# Patient Record
Sex: Female | Born: 1995 | Race: White | Hispanic: No | Marital: Married | Smoking: Former smoker
Health system: Southern US, Community
[De-identification: ages and names within clinical notes are randomized; demographics above are authoritative.]

## PROBLEM LIST (undated history)

## (undated) ENCOUNTER — Inpatient Hospital Stay (HOSPITAL_COMMUNITY): Payer: Self-pay

## (undated) DIAGNOSIS — F419 Anxiety disorder, unspecified: Secondary | ICD-10-CM

## (undated) DIAGNOSIS — F329 Major depressive disorder, single episode, unspecified: Secondary | ICD-10-CM

## (undated) DIAGNOSIS — F32A Depression, unspecified: Secondary | ICD-10-CM

## (undated) HISTORY — PX: TONSILLECTOMY: SUR1361

## (undated) HISTORY — PX: ADENOIDECTOMY: SUR15

---
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
## 2013-02-04 HISTORY — PX: WISDOM TOOTH EXTRACTION: SHX21

## 2013-12-06 ENCOUNTER — Encounter (HOSPITAL_COMMUNITY): Payer: Self-pay | Admitting: *Deleted

## 2014-11-10 ENCOUNTER — Ambulatory Visit: Payer: Self-pay | Admitting: Podiatry

## 2016-04-17 ENCOUNTER — Inpatient Hospital Stay (HOSPITAL_COMMUNITY)
Admission: AD | Admit: 2016-04-17 | Discharge: 2016-04-17 | Disposition: A | Discharge status: Home / Self Care | Payer: 59 | Source: Ambulatory Visit | Attending: Obstetrics & Gynecology | Admitting: Obstetrics & Gynecology

## 2016-04-17 ENCOUNTER — Encounter (HOSPITAL_COMMUNITY): Payer: Self-pay | Admitting: *Deleted

## 2016-04-17 DIAGNOSIS — J45909 Unspecified asthma, uncomplicated: Secondary | ICD-10-CM | POA: Diagnosis not present

## 2016-04-17 DIAGNOSIS — Z87891 Personal history of nicotine dependence: Secondary | ICD-10-CM | POA: Diagnosis not present

## 2016-04-17 DIAGNOSIS — Z3A30 30 weeks gestation of pregnancy: Secondary | ICD-10-CM | POA: Insufficient documentation

## 2016-04-17 DIAGNOSIS — O9989 Other specified diseases and conditions complicating pregnancy, childbirth and the puerperium: Secondary | ICD-10-CM

## 2016-04-17 DIAGNOSIS — O99513 Diseases of the respiratory system complicating pregnancy, third trimester: Secondary | ICD-10-CM | POA: Insufficient documentation

## 2016-04-17 DIAGNOSIS — O9A213 Injury, poisoning and certain other consequences of external causes complicating pregnancy, third trimester: Secondary | ICD-10-CM | POA: Diagnosis not present

## 2016-04-17 DIAGNOSIS — Y9301 Activity, walking, marching and hiking: Secondary | ICD-10-CM | POA: Diagnosis not present

## 2016-04-17 DIAGNOSIS — W108XXA Fall (on) (from) other stairs and steps, initial encounter: Secondary | ICD-10-CM | POA: Diagnosis not present

## 2016-04-17 DIAGNOSIS — S8002XA Contusion of left knee, initial encounter: Secondary | ICD-10-CM

## 2016-04-17 DIAGNOSIS — S80212A Abrasion, left knee, initial encounter: Secondary | ICD-10-CM | POA: Diagnosis present

## 2016-04-17 DIAGNOSIS — W109XXA Fall (on) (from) unspecified stairs and steps, initial encounter: Secondary | ICD-10-CM | POA: Diagnosis not present

## 2016-04-17 DIAGNOSIS — S8000XA Contusion of unspecified knee, initial encounter: Secondary | ICD-10-CM | POA: Insufficient documentation

## 2016-04-17 LAB — URINALYSIS, DIPSTICK ONLY
BILIRUBIN URINE: NEGATIVE
Glucose, UA: NEGATIVE mg/dL
Hgb urine dipstick: NEGATIVE
KETONES UR: NEGATIVE mg/dL
LEUKOCYTES UA: NEGATIVE
NITRITE: NEGATIVE
PH: 5.5 (ref 5.0–8.0)
Protein, ur: NEGATIVE mg/dL

## 2016-04-17 NOTE — MAU Note (Signed)
Urine in lab 

## 2016-04-17 NOTE — MAU Provider Note (Signed)
History     CSN: [id number]  Arrival date and time: 04/17/16 [id number]   First Provider Initiated Contact with Patient 04/17/16 1933      Chief Complaint  Patient presents with  . Fall   [name] is a 21 y.o. G2P0101 at 9157w2d presenting for evaluation following a fall that occurred 1 PM today. She states she tripped on the top step while going up stairs, breaking the fall with her hands but forcefully striking her left knee and left abdomen. Fetus has been very active since the fall. Denies contractions, abdominal pain, vaginal bleeding or leakage of fluid.     OB History  Gravida Para Term Preterm AB Living  2 1 0 1 0 1  SAB TAB Ectopic Multiple Live Births  0 0 0 0 1    # Outcome Date GA Lbr Len/2nd Weight Sex Delivery Anes PTL Lv  2 Current           1 Preterm 08/21/10 5377w1d / 05:19 2.895 kg (6 lb 6.1 oz) F Vag-Vacuum EPI  LIV       Past Medical History:  Diagnosis Date  . Asthma     Past Surgical History:  Procedure Laterality Date  . ADENOIDECTOMY    . TONSILLECTOMY      Family History  Problem Relation Age of Onset  . Hypertension Maternal Grandfather   . Diabetes Maternal Grandfather   . Heart disease Maternal Grandfather   . Diabetes Paternal Grandfather     Social History  Substance Use Topics  . Smoking status: Former Smoker    Packs/day: 0.25    Quit date: 03/07/2010  . Smokeless tobacco: Never Used  . Alcohol use No    Allergies:  Allergies  Allergen Reactions  . Hydrocodone Shortness Of Breath  . Sulfa Antibiotics Hives    Prescriptions Prior to Admission  Medication Sig Dispense Refill Last Dose  . prenatal vitamin w/FE, FA (PRENATAL 1 + 1) 27-1 MG TABS Take 1 tablet by mouth daily.     08/20/2010 at Unknown    Review of Systems Physical Exam   Blood pressure 99/57, pulse 97, temperature 98.3 F (36.8 C), temperature source Oral, resp. rate 18, unknown if currently breastfeeding.  Physical Exam  Nursing note and vitals  reviewed. Constitutional: She is oriented to person, place, and time. She appears well-developed and well-nourished. No distress.  HENT:  Head: Normocephalic.  Eyes: Pupils are equal, round, and reactive to light.  Neck: Normal range of motion.  GI: Soft. There is no tenderness.  siz c/w 30 wks  Musculoskeletal: Normal range of motion. She exhibits no tenderness.  Skin of abdomen and flank intact, NT  Neurological: She is alert and oriented to person, place, and time.  Skin: Skin is warm and dry.  Erythema and superficial abrasion of left knee  Psychiatric: She has a normal mood and affect. Her behavior is normal.    MAU Course  Procedures  Wynelle BourgeoisMarie Laurance Heide, CNM assumed care of patient at 2000.   Assessment and Plan    [name] CNM 04/17/2016, 7:42 PM   Ice placed on knee contusion Consulted Dr Seymour BarsLavoie who recommends monitoring a while longer and checking cervix. If not dilated may discharge home FHR is reactive and reassuring Some uterine irritability noted Dilation: Closed Effacement (%): Thick Station: Ballotable Exam by:: [name] [name] CNM  Discharge home Encouraged to return here or to other Urgent Care/ED if she develops worsening of symptoms, increase in pain, fever, or  other concerning symptoms.   Strict precautions for contractions, ROM, bleeding, decreased movement reviewed  Aviva Signs, CNM

## 2016-04-17 NOTE — MAU Note (Signed)
Pt fell around 1330 today, fell up stairs, landed on L side.  Having some L rib pain, also some aching in lower abd on sides, also a back ache.  Denies bleeding or LOF.

## 2016-04-17 NOTE — Discharge Instructions (Signed)
Third Trimester of Pregnancy The third trimester is from week 28 through week 40 (months 7 through 9). The third trimester is a time when the unborn baby (fetus) is growing rapidly. At the end of the ninth month, the fetus is about 20 inches in length and weighs 6-10 pounds. Body changes during your third trimester Your body will continue to go through many changes during pregnancy. The changes vary from woman to woman. During the third trimester:  Your weight will continue to increase. You can expect to gain 25-35 pounds (11-16 kg) by the end of the pregnancy.  You may begin to get stretch marks on your hips, abdomen, and breasts.  You may urinate more often because the fetus is moving lower into your pelvis and pressing on your bladder.  You may develop or continue to have heartburn. This is caused by increased hormones that slow down muscles in the digestive tract.  You may develop or continue to have constipation because increased hormones slow digestion and cause the muscles that push waste through your intestines to relax.  You may develop hemorrhoids. These are swollen veins (varicose veins) in the rectum that can itch or be painful.  You may develop swollen, bulging veins (varicose veins) in your legs.  You may have increased body aches in the pelvis, back, or thighs. This is due to weight gain and increased hormones that are relaxing your joints.  You may have changes in your hair. These can include thickening of your hair, rapid growth, and changes in texture. Some women also have hair loss during or after pregnancy, or hair that feels dry or thin. Your hair will most likely return to normal after your baby is born.  Your breasts will continue to grow and they will continue to become tender. A yellow fluid (colostrum) may leak from your breasts. This is the first milk you are producing for your baby.  Your belly button may stick out.  You may notice more swelling in your hands,  face, or ankles.  You may have increased tingling or numbness in your hands, arms, and legs. The skin on your belly may also feel numb.  You may feel short of breath because of your expanding uterus.  You may have more problems sleeping. This can be caused by the size of your belly, increased need to urinate, and an increase in your body's metabolism.  You may notice the fetus "dropping," or moving lower in your abdomen (lightening).  You may have increased vaginal discharge.  You may notice your joints feel loose and you may have pain around your pelvic bone. What to expect at prenatal visits You will have prenatal exams every 2 weeks until week 36. Then you will have weekly prenatal exams. During a routine prenatal visit:  You will be weighed to make sure you and the baby are growing normally.  Your blood pressure will be taken.  Your abdomen will be measured to track your baby's growth.  The fetal heartbeat will be listened to.  Any test results from the previous visit will be discussed.  You may have a cervical check near your due date to see if your cervix has softened or thinned (effaced).  You will be tested for Group B streptococcus. This happens between 35 and 37 weeks. Your health care provider may ask you:  What your birth plan is.  How you are feeling.  If you are feeling the baby move.  If you have had any abnormal  symptoms, such as leaking fluid, bleeding, severe headaches, or abdominal cramping.  If you are using any tobacco products, including cigarettes, chewing tobacco, and electronic cigarettes.  If you have any questions. Other tests or screenings that may be performed during your third trimester include:  Blood tests that check for low iron levels (anemia).  Fetal testing to check the health, activity level, and growth of the fetus. Testing is done if you have certain medical conditions or if there are problems during the pregnancy.  Nonstress test  (NST). This test checks the health of your baby to make sure there are no signs of problems, such as the baby not getting enough oxygen. During this test, a belt is placed around your belly. The baby is made to move, and its heart rate is monitored during movement. What is false labor? False labor is a condition in which you feel small, irregular tightenings of the muscles in the womb (contractions) that usually go away with rest, changing position, or drinking water. These are called Braxton Hicks contractions. Contractions may last for hours, days, or even weeks before true labor sets in. If contractions come at regular intervals, become more frequent, increase in intensity, or become painful, you should see your health care provider. What are the signs of labor?  Abdominal cramps.  Regular contractions that start at 10 minutes apart and become stronger and more frequent with time.  Contractions that start on the top of the uterus and spread down to the lower abdomen and back.  Increased pelvic pressure and dull back pain.  A watery or bloody mucus discharge that comes from the vagina.  Leaking of amniotic fluid. This is also known as your "water breaking." It could be a slow trickle or a gush. Let your health care provider know if it has a color or strange odor. If you have any of these signs, call your health care provider right away, even if it is before your due date. Follow these instructions at home: Medicines   Follow your health care provider's instructions regarding medicine use. Specific medicines may be either safe or unsafe to take during pregnancy.  Take a prenatal vitamin that contains at least 600 micrograms (mcg) of folic acid.  If you develop constipation, try taking a stool softener if your health care provider approves. Eating and drinking   Eat a balanced diet that includes fresh fruits and vegetables, whole grains, good sources of protein such as meat, eggs, or tofu,  and low-fat dairy. Your health care provider will help you determine the amount of weight gain that is right for you.  Avoid raw meat and uncooked cheese. These carry germs that can cause birth defects in the baby.  If you have low calcium intake from food, talk to your health care provider about whether you should take a daily calcium supplement.  Eat four or five small meals rather than three large meals a day.  Limit foods that are high in fat and processed sugars, such as fried and sweet foods.  To prevent constipation:  Drink enough fluid to keep your urine clear or pale yellow.  Eat foods that are high in fiber, such as fresh fruits and vegetables, whole grains, and beans. Activity   Exercise only as directed by your health care provider. Most women can continue their usual exercise routine during pregnancy. Try to exercise for 30 minutes at least 5 days a week. Stop exercising if you experience uterine contractions.  Avoid heavy lifting.  Do not exercise in extreme heat or humidity, or at high altitudes.  Wear low-heel, comfortable shoes.  Practice good posture.  You may continue to have sex unless your health care provider tells you otherwise. Relieving pain and discomfort   Take frequent breaks and rest with your legs elevated if you have leg cramps or low back pain.  Take warm sitz baths to soothe any pain or discomfort caused by hemorrhoids. Use hemorrhoid cream if your health care provider approves.  Wear a good support bra to prevent discomfort from breast tenderness.  If you develop varicose veins:  Wear support pantyhose or compression stockings as told by your healthcare provider.  Elevate your feet for 15 minutes, 3-4 times a day. Prenatal care   Write down your questions. Take them to your prenatal visits.  Keep all your prenatal visits as told by your health care provider. This is important. Safety   Wear your seat belt at all times when  driving.  Make a list of emergency phone numbers, including numbers for family, friends, the hospital, and police and fire departments. General instructions   Avoid cat litter boxes and soil used by cats. These carry germs that can cause birth defects in the baby. If you have a cat, ask someone to clean the litter box for you.  Do not travel far distances unless it is absolutely necessary and only with the approval of your health care provider.  Do not use hot tubs, steam rooms, or saunas.  Do not drink alcohol.  Do not use any products that contain nicotine or tobacco, such as cigarettes and e-cigarettes. If you need help quitting, ask your health care provider.  Do not use any medicinal herbs or unprescribed drugs. These chemicals affect the formation and growth of the baby.  Do not douche or use tampons or scented sanitary pads.  Do not cross your legs for long periods of time.  To prepare for the arrival of your baby:  Take prenatal classes to understand, practice, and ask questions about labor and delivery.  Make a trial run to the hospital.  Visit the hospital and tour the maternity area.  Arrange for maternity or paternity leave through employers.  Arrange for family and friends to take care of pets while you are in the hospital.  Purchase a rear-facing car seat and make sure you know how to install it in your car.  Pack your hospital bag.  Prepare the babys nursery. Make sure to remove all pillows and stuffed animals from the baby's crib to prevent suffocation.  Visit your dentist if you have not gone during your pregnancy. Use a soft toothbrush to brush your teeth and be gentle when you floss. Contact a health care provider if:  You are unsure if you are in labor or if your water has broken.  You become dizzy.  You have mild pelvic cramps, pelvic pressure, or nagging pain in your abdominal area.  You have lower back pain.  You have persistent nausea,  vomiting, or diarrhea.  You have an unusual or bad smelling vaginal discharge.  You have pain when you urinate. Get help right away if:  Your water breaks before 37 weeks.  You have regular contractions less than 5 minutes apart before 37 weeks.  You have a fever.  You are leaking fluid from your vagina.  You have spotting or bleeding from your vagina.  You have severe abdominal pain or cramping.  You have rapid weight loss or  weight gain.  You have shortness of breath with chest pain.  You notice sudden or extreme swelling of your face, hands, ankles, feet, or legs.  Your baby makes fewer than 10 movements in 2 hours.  You have severe headaches that do not go away when you take medicine.  You have vision changes. Summary  The third trimester is from week 28 through week 40, months 7 through 9. The third trimester is a time when the unborn baby (fetus) is growing rapidly.  During the third trimester, your discomfort may increase as you and your baby continue to gain weight. You may have abdominal, leg, and back pain, sleeping problems, and an increased need to urinate.  During the third trimester your breasts will keep growing and they will continue to become tender. A yellow fluid (colostrum) may leak from your breasts. This is the first milk you are producing for your baby.  False labor is a condition in which you feel small, irregular tightenings of the muscles in the womb (contractions) that eventually go away. These are called Braxton Hicks contractions. Contractions may last for hours, days, or even weeks before true labor sets in.  Signs of labor can include: abdominal cramps; regular contractions that start at 10 minutes apart and become stronger and more frequent with time; watery or bloody mucus discharge that comes from the vagina; increased pelvic pressure and dull back pain; and leaking of amniotic fluid. This information is not intended to replace advice given  to you by your health care provider. Make sure you discuss any questions you have with your health care provider. Document Released: 01/15/2001 Document Revised: 06/29/2015 Document Reviewed: 03/24/2012 Elsevier Interactive Patient Education  2017 [employer] Contusion A contusion is a deep bruise. Contusions are the result of a blunt injury to tissues and muscle fibers under the skin. The injury causes bleeding under the skin. The skin overlying the contusion may turn blue, purple, or yellow. Minor injuries will give you a painless contusion, but more severe contusions may stay painful and swollen for a few weeks. What are the causes? This condition is usually caused by a blow, trauma, or direct force to an area of the body. What are the signs or symptoms? Symptoms of this condition include:  Swelling of the injured area.  Pain and tenderness in the injured area.  Discoloration. The area may have redness and then turn blue, purple, or yellow. How is this diagnosed? This condition is diagnosed based on a physical exam and medical history. An X-ray, CT scan, or MRI may be needed to determine if there are any associated injuries, such as broken bones (fractures). How is this treated? Specific treatment for this condition depends on what area of the body was injured. In general, the best treatment for a contusion is resting, icing, applying pressure to (compression), and elevating the injured area. This is often called the RICE strategy. Over-the-counter anti-inflammatory medicines may also be recommended for pain control. Follow these instructions at home:  Rest the injured area.  If directed, apply ice to the injured area:  Put ice in a plastic bag.  Place a towel between your skin and the bag.  Leave the ice on for 20 minutes, 2-3 times per day.  If directed, apply light compression to the injured area using an elastic bandage. Make sure the bandage is not wrapped too tightly.  Remove and reapply the bandage as directed by your health care provider.  If possible, raise (elevate) the  injured area above the level of your heart while you are sitting or lying down.  Take over-the-counter and prescription medicines only as told by your health care provider. Contact a health care provider if:  Your symptoms do not improve after several days of treatment.  Your symptoms get worse.  You have difficulty moving the injured area. Get help right away if:  You have severe pain.  You have numbness in a hand or foot.  Your hand or foot turns pale or cold. This information is not intended to replace advice given to you by your health care provider. Make sure you discuss any questions you have with your health care provider. Document Released: 10/31/2004 Document Revised: 06/01/2015 Document Reviewed: 06/08/2014 Elsevier Interactive Patient Education  2017 [employer].

## 2016-05-16 ENCOUNTER — Inpatient Hospital Stay (HOSPITAL_COMMUNITY)
Admission: AD | Admit: 2016-05-16 | Discharge: 2016-05-16 | Disposition: A | Discharge status: Home / Self Care | Payer: 59 | Source: Ambulatory Visit | Attending: Obstetrics and Gynecology | Admitting: Obstetrics and Gynecology

## 2016-05-16 ENCOUNTER — Other Ambulatory Visit: Payer: Self-pay

## 2016-05-16 DIAGNOSIS — O09213 Supervision of pregnancy with history of pre-term labor, third trimester: Secondary | ICD-10-CM

## 2016-05-16 DIAGNOSIS — O09219 Supervision of pregnancy with history of pre-term labor, unspecified trimester: Secondary | ICD-10-CM | POA: Insufficient documentation

## 2016-05-16 MED ORDER — BETAMETHASONE SOD PHOS & ACET 6 (3-3) MG/ML IJ SUSP
12.0000 mg | Freq: Once | INTRAMUSCULAR | Status: AC
  Administered 2016-05-16: 12 mg via INTRAMUSCULAR
  Filled 2016-05-16: qty 2

## 2016-05-16 MED ORDER — BETAMETHASONE SOD PHOS & ACET 6 (3-3) MG/ML IJ SUSP: 12.0000 mg | INTRAMUSCULAR | Status: AC

## 2016-05-16 NOTE — MAU Note (Signed)
Sent from [facility] [facility] for betamethasone.

## 2016-05-17 ENCOUNTER — Inpatient Hospital Stay (HOSPITAL_COMMUNITY)
Admission: AD | Admit: 2016-05-17 | Discharge: 2016-05-17 | Disposition: A | Discharge status: Home / Self Care | Payer: 59 | Source: Ambulatory Visit | Attending: Obstetrics and Gynecology | Admitting: Obstetrics and Gynecology

## 2016-05-17 DIAGNOSIS — Z3A34 34 weeks gestation of pregnancy: Secondary | ICD-10-CM | POA: Diagnosis not present

## 2016-05-17 DIAGNOSIS — O09213 Supervision of pregnancy with history of pre-term labor, third trimester: Secondary | ICD-10-CM | POA: Insufficient documentation

## 2016-05-17 MED ORDER — BETAMETHASONE SOD PHOS & ACET 6 (3-3) MG/ML IJ SUSP
12.0000 mg | Freq: Once | INTRAMUSCULAR | Status: AC
  Administered 2016-05-17: 12 mg via INTRAMUSCULAR
  Filled 2016-05-17: qty 2

## 2016-05-17 NOTE — MAU Note (Signed)
No c/o.  Did ok with injection yesterday, nothing has changed

## 2016-11-07 ENCOUNTER — Other Ambulatory Visit: Payer: Self-pay | Admitting: Obstetrics and Gynecology

## 2016-11-08 ENCOUNTER — Ambulatory Visit
Admission: RE | Admit: 2016-11-08 | Discharge: 2016-11-08 | Disposition: A | Discharge status: Home / Self Care | Payer: 59 | Source: Ambulatory Visit | Attending: Obstetrics and Gynecology | Admitting: Obstetrics and Gynecology

## 2016-11-08 ENCOUNTER — Other Ambulatory Visit: Payer: Self-pay | Admitting: Obstetrics and Gynecology

## 2016-11-08 ENCOUNTER — Other Ambulatory Visit: Payer: 59

## 2016-11-08 ENCOUNTER — Encounter (HOSPITAL_COMMUNITY): Payer: Self-pay | Admitting: *Deleted

## 2016-11-08 DIAGNOSIS — T8332XA Displacement of intrauterine contraceptive device, initial encounter: Secondary | ICD-10-CM

## 2016-11-08 DIAGNOSIS — T8332XD Displacement of intrauterine contraceptive device, subsequent encounter: Secondary | ICD-10-CM

## 2016-11-10 NOTE — H&P (Signed)
NAMEIVYLYNN, Heather Calderon NO.:  [id number]  MEDICAL RECORD NO.:  [id number]  LOCATION:  [facility]                         FACILITY:  [facility]  PHYSICIAN:  Lenoard Aden, M.D.DATE OF BIRTH:  November 22, 1995  DATE OF ADMISSION:  11/11/2016 DATE OF DISCHARGE:                             HISTORY & PHYSICAL   CHIEF COMPLAINT:  Extrauterine IUD.  HISTORY OF PRESENT ILLNESS:  The patient is a 21 year old female G2, P2 with a history of uncomplicated IUD placement done in June, presenting for followup with lost IUD strings.  Ultrasound and KUB raise suspicion of an IUD in the pelvis.  CAT scan confirms a right lateral pelvic placement of an IUD consistent with perforation.  ALLERGIES:  The patient has allergies to hydrocodone, sulfa, and nuts.  FAMILY HISTORY:  Of thrombophlebitis, hypertension, and diabetes.  SOCIAL HISTORY:  Nonsmoker, nondrinker.  Denies domestic or physical violence.  GYNECOLOGIC HISTORY:  History of vaginal delivery x2.  PHYSICAL EXAMINATION:  GENERAL:  Well-developed, well-nourished female; in no acute distress. HEENT:  Normal. NECK:  Supple.  Full range of motion. LUNGS:  Clear. HEART:  Regular rate and rhythm. ABDOMEN:  Soft, nontender. PELVIC:  Reveals a normal size uterus.  No adnexal tenderness and no adnexal masses.  No IUD strings are seen.  The uterus is retroverted and nontender.  IMPRESSION:  Extrapelvic intrauterine contraceptive device, status post perforation likely upon placement in June 2018.  PLAN:  Proceed with diagnostic laparoscopy, removal of IUD.  The patient is status post antibiotic treatment and negative pregnancy test.  Risks of anesthesia, infection, bleeding, injury to surrounding organs, and possible need for repair was discussed; delayed versus immediate complications to include bowel and bladder injury noted.  The patient acknowledges and wishes to proceed.     Lenoard Aden, M.D.     RJT/MEDQ  D:   11/10/2016  T:  11/10/2016  Job:  [id number]  cc:   Dr. __________

## 2016-11-11 ENCOUNTER — Encounter (HOSPITAL_COMMUNITY): Payer: Self-pay

## 2016-11-11 ENCOUNTER — Ambulatory Visit (HOSPITAL_COMMUNITY): Payer: 59 | Admitting: Certified Registered Nurse Anesthetist

## 2016-11-11 ENCOUNTER — Encounter (HOSPITAL_COMMUNITY)
Admission: RE | Disposition: A | Discharge status: Home / Self Care | Payer: Self-pay | Source: Ambulatory Visit | Attending: Obstetrics and Gynecology

## 2016-11-11 ENCOUNTER — Ambulatory Visit (HOSPITAL_COMMUNITY)
Admission: RE | Admit: 2016-11-11 | Discharge: 2016-11-11 | Disposition: A | Discharge status: Home / Self Care | Payer: 59 | Source: Ambulatory Visit | Attending: Obstetrics and Gynecology | Admitting: Obstetrics and Gynecology

## 2016-11-11 DIAGNOSIS — Z91018 Allergy to other foods: Secondary | ICD-10-CM | POA: Diagnosis not present

## 2016-11-11 DIAGNOSIS — T8389XA Other specified complication of genitourinary prosthetic devices, implants and grafts, initial encounter: Secondary | ICD-10-CM | POA: Diagnosis not present

## 2016-11-11 DIAGNOSIS — N736 Female pelvic peritoneal adhesions (postinfective): Secondary | ICD-10-CM | POA: Diagnosis not present

## 2016-11-11 DIAGNOSIS — T8332XA Displacement of intrauterine contraceptive device, initial encounter: Secondary | ICD-10-CM | POA: Insufficient documentation

## 2016-11-11 DIAGNOSIS — Z885 Allergy status to narcotic agent status: Secondary | ICD-10-CM | POA: Diagnosis not present

## 2016-11-11 DIAGNOSIS — Z882 Allergy status to sulfonamides status: Secondary | ICD-10-CM | POA: Insufficient documentation

## 2016-11-11 HISTORY — PX: LAPAROSCOPY: SHX197

## 2016-11-11 HISTORY — DX: Anxiety disorder, unspecified: F41.9

## 2016-11-11 HISTORY — DX: Depression, unspecified: F32.A

## 2016-11-11 HISTORY — DX: Major depressive disorder, single episode, unspecified: F32.9

## 2016-11-11 LAB — CBC
HEMATOCRIT: 45 % (ref 36.0–46.0)
Hemoglobin: 15.9 g/dL — ABNORMAL HIGH (ref 12.0–15.0)
MCH: 29.7 pg (ref 26.0–34.0)
MCHC: 35.3 g/dL (ref 30.0–36.0)
MCV: 84.1 fL (ref 78.0–100.0)
Platelets: 272 10*3/uL (ref 150–400)
RBC: 5.35 MIL/uL — AB (ref 3.87–5.11)
RDW: 12.5 % (ref 11.5–15.5)
WBC: 8.1 10*3/uL (ref 4.0–10.5)

## 2016-11-11 LAB — HCG, SERUM, QUALITATIVE: Preg, Serum: NEGATIVE

## 2016-11-11 SURGERY — LAPAROSCOPY, DIAGNOSTIC
Anesthesia: General | Site: Abdomen

## 2016-11-11 MED ORDER — PROPOFOL 10 MG/ML IV BOLUS
INTRAVENOUS | Status: DC | PRN
  Administered 2016-11-11: 200 mg via INTRAVENOUS

## 2016-11-11 MED ORDER — BUPIVACAINE HCL (PF) 0.25 % IJ SOLN
INTRAMUSCULAR | Status: AC
  Filled 2016-11-11: qty 30

## 2016-11-11 MED ORDER — LIDOCAINE HCL (CARDIAC) 20 MG/ML IV SOLN
INTRAVENOUS | Status: DC | PRN
  Administered 2016-11-11: 50 mg via INTRAVENOUS

## 2016-11-11 MED ORDER — SODIUM CHLORIDE 0.9 % IR SOLN
Status: DC | PRN
  Administered 2016-11-11: 3000 mL

## 2016-11-11 MED ORDER — HYDROMORPHONE HCL 2 MG PO TABS: ORAL_TABLET | ORAL | 0 refills | Status: DC

## 2016-11-11 MED ORDER — PROMETHAZINE HCL 25 MG/ML IJ SOLN
INTRAMUSCULAR | Status: AC
  Filled 2016-11-11: qty 1

## 2016-11-11 MED ORDER — SUGAMMADEX SODIUM 200 MG/2ML IV SOLN
INTRAVENOUS | Status: AC
  Filled 2016-11-11: qty 2

## 2016-11-11 MED ORDER — SODIUM CHLORIDE 0.9 % IJ SOLN
INTRAMUSCULAR | Status: AC
  Filled 2016-11-11: qty 10

## 2016-11-11 MED ORDER — LACTATED RINGERS IV SOLN
INTRAVENOUS | Status: DC
  Administered 2016-11-11 (×2): via INTRAVENOUS

## 2016-11-11 MED ORDER — SUGAMMADEX SODIUM 200 MG/2ML IV SOLN
INTRAVENOUS | Status: DC | PRN
  Administered 2016-11-11: 150 mg via INTRAVENOUS

## 2016-11-11 MED ORDER — MEPERIDINE HCL 25 MG/ML IJ SOLN: 6.2500 mg | INTRAMUSCULAR | Status: DC | PRN

## 2016-11-11 MED ORDER — PROMETHAZINE HCL 25 MG/ML IJ SOLN
6.2500 mg | Freq: Once | INTRAMUSCULAR | Status: AC
  Administered 2016-11-11: 6.25 mg via INTRAVENOUS

## 2016-11-11 MED ORDER — KETOROLAC TROMETHAMINE 30 MG/ML IJ SOLN
INTRAMUSCULAR | Status: DC | PRN
  Administered 2016-11-11: 30 mg via INTRAVENOUS

## 2016-11-11 MED ORDER — SCOPOLAMINE 1 MG/3DAYS TD PT72
1.0000 | MEDICATED_PATCH | Freq: Once | TRANSDERMAL | Status: DC
  Administered 2016-11-11: 1.5 mg via TRANSDERMAL

## 2016-11-11 MED ORDER — CEFAZOLIN SODIUM-DEXTROSE 2-4 GM/100ML-% IV SOLN
2.0000 g | INTRAVENOUS | Status: AC
  Administered 2016-11-11: 2 g via INTRAVENOUS

## 2016-11-11 MED ORDER — MIDAZOLAM HCL 2 MG/2ML IJ SOLN
INTRAMUSCULAR | Status: AC
  Filled 2016-11-11: qty 4

## 2016-11-11 MED ORDER — FENTANYL CITRATE (PF) 100 MCG/2ML IJ SOLN
INTRAMUSCULAR | Status: AC
  Filled 2016-11-11: qty 2

## 2016-11-11 MED ORDER — HYDROMORPHONE HCL 1 MG/ML IJ SOLN
INTRAMUSCULAR | Status: AC
  Filled 2016-11-11: qty 1

## 2016-11-11 MED ORDER — DEXAMETHASONE SODIUM PHOSPHATE 10 MG/ML IJ SOLN
INTRAMUSCULAR | Status: DC | PRN
  Administered 2016-11-11: 4 mg via INTRAVENOUS

## 2016-11-11 MED ORDER — LACTATED RINGERS IV SOLN: INTRAVENOUS | Status: DC

## 2016-11-11 MED ORDER — HYDROMORPHONE HCL 1 MG/ML IJ SOLN
0.2500 mg | INTRAMUSCULAR | Status: DC | PRN
  Administered 2016-11-11: 0.5 mg via INTRAVENOUS

## 2016-11-11 MED ORDER — MIDAZOLAM HCL 2 MG/2ML IJ SOLN
INTRAMUSCULAR | Status: DC | PRN
  Administered 2016-11-11: 2 mg via INTRAVENOUS

## 2016-11-11 MED ORDER — ROCURONIUM BROMIDE 100 MG/10ML IV SOLN
INTRAVENOUS | Status: DC | PRN
  Administered 2016-11-11: 40 mg via INTRAVENOUS

## 2016-11-11 MED ORDER — CEFAZOLIN SODIUM-DEXTROSE 2-4 GM/100ML-% IV SOLN
INTRAVENOUS | Status: AC
  Filled 2016-11-11: qty 100

## 2016-11-11 MED ORDER — BUPIVACAINE HCL (PF) 0.25 % IJ SOLN
INTRAMUSCULAR | Status: DC | PRN
  Administered 2016-11-11: 20 mL

## 2016-11-11 MED ORDER — KETOROLAC TROMETHAMINE 30 MG/ML IJ SOLN
INTRAMUSCULAR | Status: AC
  Filled 2016-11-11: qty 1

## 2016-11-11 MED ORDER — FENTANYL CITRATE (PF) 100 MCG/2ML IJ SOLN
INTRAMUSCULAR | Status: DC | PRN
  Administered 2016-11-11 (×2): 50 ug via INTRAVENOUS

## 2016-11-11 MED ORDER — DEXAMETHASONE SODIUM PHOSPHATE 4 MG/ML IJ SOLN
INTRAMUSCULAR | Status: AC
  Filled 2016-11-11: qty 2

## 2016-11-11 MED ORDER — OXYCODONE HCL 5 MG/5ML PO SOLN: 5.0000 mg | Freq: Once | ORAL | Status: DC | PRN

## 2016-11-11 MED ORDER — HYDROMORPHONE HCL 2 MG PO TABS
ORAL_TABLET | ORAL | Status: AC
  Filled 2016-11-11: qty 1

## 2016-11-11 MED ORDER — LIDOCAINE HCL (PF) 1 % IJ SOLN
INTRAMUSCULAR | Status: AC
  Filled 2016-11-11: qty 5

## 2016-11-11 MED ORDER — OXYCODONE HCL 5 MG PO TABS: 5.0000 mg | ORAL_TABLET | Freq: Once | ORAL | Status: DC | PRN

## 2016-11-11 MED ORDER — PROMETHAZINE HCL 25 MG/ML IJ SOLN: 6.2500 mg | INTRAMUSCULAR | Status: DC | PRN

## 2016-11-11 MED ORDER — ONDANSETRON HCL 4 MG/2ML IJ SOLN
INTRAMUSCULAR | Status: DC | PRN
  Administered 2016-11-11: 4 mg via INTRAVENOUS

## 2016-11-11 MED ORDER — ONDANSETRON HCL 4 MG/2ML IJ SOLN
INTRAMUSCULAR | Status: AC
  Filled 2016-11-11: qty 4

## 2016-11-11 MED ORDER — SCOPOLAMINE 1 MG/3DAYS TD PT72
MEDICATED_PATCH | TRANSDERMAL | Status: DC
Start: 2016-11-11 — End: 2016-11-11
  Administered 2016-11-11: 1.5 mg via TRANSDERMAL
  Filled 2016-11-11: qty 1

## 2016-11-11 MED ORDER — HYDROMORPHONE HCL 2 MG PO TABS
2.0000 mg | ORAL_TABLET | Freq: Once | ORAL | Status: AC
  Administered 2016-11-11: 2 mg via ORAL

## 2016-11-11 MED ORDER — PROPOFOL 10 MG/ML IV BOLUS
INTRAVENOUS | Status: AC
  Filled 2016-11-11: qty 40

## 2016-11-11 MED ORDER — OXYCODONE HCL 5 MG PO TABS
ORAL_TABLET | ORAL | Status: AC
  Filled 2016-11-11: qty 1

## 2016-11-11 MED ORDER — OXYCODONE-ACETAMINOPHEN 5-325 MG PO TABS: 1.0000 | ORAL_TABLET | ORAL | 0 refills | Status: DC | PRN

## 2016-11-11 SURGICAL SUPPLY — 30 items
ADH SKN CLS APL DERMABOND .7 (GAUZE/BANDAGES/DRESSINGS) ×1
CABLE HIGH FREQUENCY MONO STRZ (ELECTRODE) IMPLANT
CATH ROBINSON RED A/P 16FR (CATHETERS) IMPLANT
CLOTH BEACON ORANGE TIMEOUT ST (SAFETY) ×3 IMPLANT
DERMABOND ADVANCED (GAUZE/BANDAGES/DRESSINGS) ×2
DERMABOND ADVANCED .7 DNX12 (GAUZE/BANDAGES/DRESSINGS) ×1 IMPLANT
DRSG OPSITE POSTOP 3X4 (GAUZE/BANDAGES/DRESSINGS) ×2 IMPLANT
FORCEPS CUTTING 33CM 5MM (CUTTING FORCEPS) IMPLANT
FORCEPS CUTTING 45CM 5MM (CUTTING FORCEPS) IMPLANT
GLOVE BIO SURGEON STRL SZ7.5 (GLOVE) ×3 IMPLANT
GLOVE BIOGEL PI IND STRL 7.0 (GLOVE) ×1 IMPLANT
GLOVE BIOGEL PI INDICATOR 7.0 (GLOVE) ×2
GOWN STRL REUS W/TWL LRG LVL3 (GOWN DISPOSABLE) ×3 IMPLANT
NEEDLE INSUFFLATION 120MM (ENDOMECHANICALS) ×3 IMPLANT
PACK LAPAROSCOPY BASIN (CUSTOM PROCEDURE TRAY) ×3 IMPLANT
PACK TRENDGUARD 450 HYBRID PRO (MISCELLANEOUS) IMPLANT
PACK TRENDGUARD 600 HYBRD PROC (MISCELLANEOUS) IMPLANT
PROTECTOR NERVE ULNAR (MISCELLANEOUS) ×4 IMPLANT
SET IRRIG TUBING LAPAROSCOPIC (IRRIGATION / IRRIGATOR) ×2 IMPLANT
SLEEVE XCEL OPT CAN 5 100 (ENDOMECHANICALS) ×3 IMPLANT
SOLUTION ELECTROLUBE (MISCELLANEOUS) ×3 IMPLANT
SUT VICRYL 0 UR6 27IN ABS (SUTURE) ×3 IMPLANT
SUT VICRYL 4-0 PS2 18IN ABS (SUTURE) ×6 IMPLANT
TOWEL OR 17X24 6PK STRL BLUE (TOWEL DISPOSABLE) ×6 IMPLANT
TRAY FOLEY CATH SILVER 14FR (SET/KITS/TRAYS/PACK) ×3 IMPLANT
TRENDGUARD 450 HYBRID PRO PACK (MISCELLANEOUS) ×3
TRENDGUARD 600 HYBRID PROC PK (MISCELLANEOUS)
TROCAR OPTI TIP 5M 100M (ENDOMECHANICALS) ×4 IMPLANT
TROCAR XCEL DIL TIP R 11M (ENDOMECHANICALS) ×3 IMPLANT
WARMER LAPAROSCOPE (MISCELLANEOUS) ×3 IMPLANT

## 2016-11-11 NOTE — Anesthesia Preprocedure Evaluation (Addendum)
Anesthesia Evaluation  Patient identified by MRN, date of birth, ID band Patient awake    Reviewed: Allergy & Precautions, NPO status , Patient's Chart, lab work & pertinent test results  Airway Mallampati: I  TM Distance: >3 FB Neck ROM: Full    Dental  (+) Teeth Intact, Dental Advisory Given   Pulmonary asthma , former smoker,    breath sounds clear to auscultation       Cardiovascular negative cardio ROS   Rhythm:Regular Rate:Normal     Neuro/Psych Anxiety negative neurological ROS     GI/Hepatic negative GI ROS, Neg liver ROS,   Endo/Other  negative endocrine ROS  Renal/GU negative Renal ROS     Musculoskeletal negative musculoskeletal ROS (+)   Abdominal   Peds  Hematology negative hematology ROS (+)   Anesthesia Other Findings Day of surgery medications reviewed with the patient.  Reproductive/Obstetrics                            Anesthesia Physical Anesthesia Plan  ASA: II  Anesthesia Plan: General   Post-op Pain Management:    Induction: Intravenous  PONV Risk Score and Plan: 4 or greater and Ondansetron, Dexamethasone, Midazolam, Scopolamine patch - Pre-op and Treatment may vary due to age or medical condition  Airway Management Planned: Oral ETT  Additional Equipment:   Intra-op Plan:   Post-operative Plan: Extubation in OR  Informed Consent: I have reviewed the patients History and Physical, chart, labs and discussed the procedure including the risks, benefits and alternatives for the proposed anesthesia with the patient or authorized representative who has indicated his/her understanding and acceptance.   Dental advisory given  Plan Discussed with: CRNA  Anesthesia Plan Comments:        Anesthesia Quick Evaluation

## 2016-11-11 NOTE — Transfer of Care (Signed)
Immediate Anesthesia Transfer of Care Note  Patient: Heather Calderon  Procedure(s) Performed: LAPAROSCOPY DIAGNOSTIC/IUD Removal (N/A Abdomen)  Patient Location: PACU  Anesthesia Type:General  Level of Consciousness: awake, alert  and oriented  Airway & Oxygen Therapy: Patient Spontanous Breathing and Patient connected to nasal cannula oxygen  Post-op Assessment: Report given to RN and Post -op Vital signs reviewed and stable  Post vital signs: Reviewed and stable  Last Vitals:  Vitals:   11/11/16 0738  Pulse: 76  Resp: 16  Temp: 36.7 C  SpO2: 98%    Last Pain:  Vitals:   11/11/16 0738  TempSrc: Oral  PainSc: 1       Patients Stated Pain Goal: 3 (11/11/16 1610)  Complications: No apparent anesthesia complications

## 2016-11-11 NOTE — Anesthesia Procedure Notes (Signed)
Procedure Name: Intubation Date/Time: 11/11/2016 9:02 AM Performed by: Cleda Clarks Pre-anesthesia Checklist: Patient identified, Emergency Drugs available, Suction available and Patient being monitored Patient Re-evaluated:Patient Re-evaluated prior to induction Oxygen Delivery Method: Circle system utilized Preoxygenation: Pre-oxygenation with 100% oxygen Induction Type: IV induction Ventilation: Mask ventilation without difficulty Laryngoscope Size: Miller and 2 Grade View: Grade I Tube type: Oral Tube size: 7.0 mm Number of attempts: 1 Airway Equipment and Method: Stylet Placement Confirmation: ETT inserted through vocal cords under direct vision,  positive ETCO2 and breath sounds checked- equal and bilateral Secured at: 21 cm Tube secured with: Tape Dental Injury: Teeth and Oropharynx as per pre-operative assessment

## 2016-11-11 NOTE — Progress Notes (Signed)
Patient seen and examined. Consent witnessed and signed. No changes noted. Update completed. Patient ID: Heather Calderon, female   DOB: [birth year], 21 y.o.   MRN: [id number]

## 2016-11-11 NOTE — Discharge Instructions (Signed)
DISCHARGE INSTRUCTIONS: Laparoscopy  The following instructions have been prepared to help you care for yourself upon your return home today.  Wound care: . Do not get the incision wet for the first 24 hours. The incision should be kept clean and dry. . The Band-Aids or dressings may be removed the day after surgery. . Should the incision become sore, red, and swollen after the first week, check with your doctor.  Personal hygiene: . Shower the day after your procedure.  Activity and limitations: . Do NOT drive or operate any equipment today. . Do NOT lift anything more than 15 pounds for 2-3 weeks after surgery. . Do NOT rest in bed all day. . Walking is encouraged. Walk each day, starting slowly with 5-minute walks 3 or 4 times a day. Slowly increase the length of your walks. . Walk up and down stairs slowly. . Do NOT do strenuous activities, such as golfing, playing tennis, bowling, running, biking, weight lifting, gardening, mowing, or vacuuming for 2-4 weeks. Ask your doctor when it is okay to start.  Diet: Eat a light meal as desired this evening. You may resume your usual diet tomorrow.  Return to work: This is dependent on the type of work you do. For the most part you can return to a desk job within a week of surgery. If you are more active at work, please discuss this with your doctor.  What to expect after your surgery: You may have a slight burning sensation when you urinate on the first day. You may have a very small amount of blood in the urine. Expect to have a small amount of vaginal discharge/light bleeding for 1-2 weeks. It is not unusual to have abdominal soreness and bruising for up to 2 weeks. You may be tired and need more rest for about 1 week. You may experience shoulder pain for 24-72 hours. Lying flat in bed may relieve it.  Call your doctor for any of the following: . Develop a fever of 100.4 or greater . Inability to urinate 6 hours after discharge from  hospital . Severe pain not relieved by pain medications . Persistent of heavy bleeding at incision site . Redness or swelling around incision site after a week . Increasing nausea or vomiting  Patient Signature________________________________________ Nurse Signature_________________________________________     Post Anesthesia Home Care Instructions  Activity: Get plenty of rest for the remainder of the day. A responsible individual must stay with you for 24 hours following the procedure.  For the next 24 hours, DO NOT: -Drive a car -Operate machinery -Drink alcoholic beverages -Take any medication unless instructed by your physician -Make any legal decisions or sign important papers.  Meals: Start with liquid foods such as gelatin or soup. Progress to regular foods as tolerated. Avoid greasy, spicy, heavy foods. If nausea and/or vomiting occur, drink only clear liquids until the nausea and/or vomiting subsides. Call your physician if vomiting continues.  Special Instructions/Symptoms: Your throat may feel dry or sore from the anesthesia or the breathing tube placed in your throat during surgery. If this causes discomfort, gargle with warm salt water. The discomfort should disappear within 24 hours.  If you had a scopolamine patch placed behind your ear for the management of post- operative nausea and/or vomiting:  1. The medication in the patch is effective for 72 hours, after which it should be removed.  Wrap patch in a tissue and discard in the trash. Wash hands thoroughly with soap and water. 2. You   remove the patch earlier than 72 hours if you experience unpleasant side effects which may include dry mouth, dizziness or visual disturbances. 3. Avoid touching the patch. Wash your hands with soap and water after contact with the patch.    Post Anesthesia Home Care Instructions  Activity: Get plenty of rest for the remainder of the day. A responsible individual must stay with  you for 24 hours following the procedure.  For the next 24 hours, DO NOT: -Drive a car -Advertising copywriter -Drink alcoholic beverages -Take any medication unless instructed by your physician -Make any legal decisions or sign important papers.  Meals: Start with liquid foods such as gelatin or soup. Progress to regular foods as tolerated. Avoid greasy, spicy, heavy foods. If nausea and/or vomiting occur, drink only clear liquids until the nausea and/or vomiting subsides. Call your physician if vomiting continues.  Special Instructions/Symptoms: Your throat may feel dry or sore from the anesthesia or the breathing tube placed in your throat during surgery. If this causes discomfort, gargle with warm salt water. The discomfort should disappear within 24 hours.  If you had a scopolamine patch placed behind your ear for the management of post- operative nausea and/or vomiting:  1. The medication in the patch is effective for 72 hours, after which it should be removed.  Wrap patch in a tissue and discard in the trash. Wash hands thoroughly with soap and water. 2. You may remove the patch earlier than 72 hours if you experience unpleasant side effects which may include dry mouth, dizziness or visual disturbances. 3. Avoid touching the patch. Wash your hands with soap and water after contact with the patch.    Post Anesthesia Home Care Instructions  Activity: Get plenty of rest for the remainder of the day. A responsible individual must stay with you for 24 hours following the procedure.  For the next 24 hours, DO NOT: -Drive a car -Advertising copywriter -Drink alcoholic beverages -Take any medication unless instructed by your physician -Make any legal decisions or sign important papers.  Meals: Start with liquid foods such as gelatin or soup. Progress to regular foods as tolerated. Avoid greasy, spicy, heavy foods. If nausea and/or vomiting occur, drink only clear liquids until the nausea and/or  vomiting subsides. Call your physician if vomiting continues.  Special Instructions/Symptoms: Your throat may feel dry or sore from the anesthesia or the breathing tube placed in your throat during surgery. If this causes discomfort, gargle with warm salt water. The discomfort should disappear within 24 hours.  If you had a scopolamine patch placed behind your ear for the management of post- operative nausea and/or vomiting:  1. The medication in the patch is effective for 72 hours, after which it should be removed.  Wrap patch in a tissue and discard in the trash. Wash hands thoroughly with soap and water. 2. You may remove the patch earlier than 72 hours if you experience unpleasant side effects which may include dry mouth, dizziness or visual disturbances. 3. Avoid touching the patch. Wash your hands with soap and water after contact with the patch.   NO IBUPROFEN PRODUCTS (MOTRIN, ADVIL) OR ALEVE UNTIL 3:30PM TODAY.

## 2016-11-11 NOTE — Anesthesia Postprocedure Evaluation (Signed)
Anesthesia Post Note  Patient: Heather Calderon  Procedure(s) Performed: LAPAROSCOPY DIAGNOSTIC/IUD Removal (N/A Abdomen)     Patient location during evaluation: PACU Anesthesia Type: General Level of consciousness: awake and alert Pain management: pain level controlled Vital Signs Assessment: post-procedure vital signs reviewed and stable Respiratory status: spontaneous breathing, nonlabored ventilation, respiratory function stable and patient connected to nasal cannula oxygen Cardiovascular status: blood pressure returned to baseline and stable Postop Assessment: no apparent nausea or vomiting Anesthetic complications: no    Last Vitals:  Vitals:   11/11/16 1130 11/11/16 1227  BP:  102/61  Pulse: 66 62  Resp: 16 16  Temp: 36.9 C   SpO2: 99% 97%    Last Pain:  Vitals:   11/11/16 1227  TempSrc:   PainSc: 3    Pain Goal: Patients Stated Pain Goal: 3 (11/11/16 1003)               Shelton Silvas

## 2016-11-11 NOTE — Op Note (Signed)
11/11/2016  10:03 AM  PATIENT:  Collier Bullock  21 y.o. female  PRE-OPERATIVE DIAGNOSIS:  Intra-abdominal IUD  POST-OPERATIVE DIAGNOSIS:  Intra-abdominal IUD  PROCEDURE:  Procedure(s): LAPAROSCOPY DIAGNOSTIC IUD Removal Lysis of Adhesions  SURGEON:  Surgeon(s): Olivia Mackie, MD  ASSISTANTS: Fredric Mare, CNM   ANESTHESIA:   local and general  ESTIMATED BLOOD LOSS: minimal  DRAINS: Urinary Catheter (Foley)   LOCAL MEDICATIONS USED:  MARCAINE    and Amount: 20 ml  SPECIMEN:  No Specimen  DISPOSITION OF SPECIMEN:  N/A  COUNTS:  YES  DICTATION #: [id number]  PLAN OF CARE: dc home  PATIENT DISPOSITION:  PACU - hemodynamically stable.

## 2016-11-11 NOTE — OR Nursing (Signed)
Dr Billy Coast request IUD to be discarded post-op.

## 2016-11-12 ENCOUNTER — Encounter (HOSPITAL_COMMUNITY): Payer: Self-pay | Admitting: Obstetrics and Gynecology

## 2016-11-12 NOTE — Op Note (Signed)
[name], Heather Calderon NO.:  [id number]  MEDICAL RECORD NO.:  [id number]  LOCATION:  PERIO                         FACILITY:  [facility]  PHYSICIAN:  Lenoard Aden, M.D.DATE OF BIRTH:  03/21/1995  DATE OF PROCEDURE: DATE OF DISCHARGE:                              OPERATIVE REPORT   PREOPERATIVE DIAGNOSIS:  Intraabdominal intrauterine device.  POSTOPERATIVE DIAGNOSES: 1. Intraabdominal intrauterine device. 2. Pelvic adhesions.  PROCEDURES:  Diagnostic laparoscopy with intrauterine device removal and lysis of right adnexal to intrauterine device adhesion.  SURGEON:  Lenoard Aden, M.D.  ASSISTANT:  Marlinda Mike, C.N.M.  ANESTHESIA:  Local and general.  ESTIMATED BLOOD LOSS:  Minimal.  COMPLICATIONS:  None.  DRAINS:  Foley.  COUNTS:  Correct.  The patient to recovery in good condition.  SPECIMEN:  None.  BRIEF OPERATIVE NOTE:  After being apprised of risks of anesthesia, infection, bleeding, injury to surrounding organs, possible need for repair, delayed versus immediate complications to include bowel and bladder injury, possible need for repair, the patient was brought to the operating room, where she was administered general anesthetic without complications, prepped and draped in usual sterile fashion.  Foley catheter placed.  Exam under anesthesia reveals a retroflexed uterus and no adnexal masses appreciated.  At this time, the Hulka tenaculum was placed vaginally without difficulty.  Infraumbilical incision made with a scalpel.  Veress needle placed, opening pressure -2, 3 L CO2 insufflated without difficulty, trocar placed atraumatically.  Pictures taken.  Visualization reveals normal liver, gallbladder bed, normal appendiceal area, normal uterus, normal anterior and posterior cul-de- sac along the right ovarian fossa.  There is obvious evidence of an extrauterine IUD which has some adhesions into the right ovarian fossa just caudad to the  ureter.  Two 5 mm ports were made in left and right. The ovary was elevated and the IUD was sharply dissected off the pelvic sidewall and removed through the operative port of the scope.  At this time, the irrigation was accomplished.  Hemostasis was achieved, keeping the ureter well out of the field using Kleppinger bipolar cautery.  Good hemostasis noted.  CO2 was released and revisualization reveals good hemostasis.  Noting that there are normal tubes and normal ovaries, pictures were taken.  Irrigation once again accomplished.  CO2 released. Positive pressure applied.  All instruments removed under direct visualization.  Incisions closed using 0 Vicryl, 4-0 Vicryl, Dermabond. Hulka tenaculum removed vaginally.  The patient tolerated the procedure well, was awakened and transferred to recovery in good condition.     Lenoard Aden, M.D.     RJT/MEDQ  D:  11/11/2016  T:  11/12/2016  Job:  [id number]  cc:   Lenoard Aden, M.D. Fax: [phone]

## 2018-02-21 IMAGING — CT CT PELVIS W/O CM
1 series · 16 of 32 positions shown, 20 images · non-contrast
Comparison: None.

CLINICAL DATA: Lost IUD strings. IUD not visualized on ultrasound,
but present on plain film.

EXAM:
CT PELVIS WITHOUT CONTRAST
TECHNIQUE: Multidetector CT imaging of the pelvis was performed following the
standard protocol without intravenous contrast.

[Series 2: routine pelvis w/(date) · axial · 0.80mm/px · z∈[-254,-59]mm · 16 of 44 slices shown, 20 images]
[im 3/44  soft-tissue]
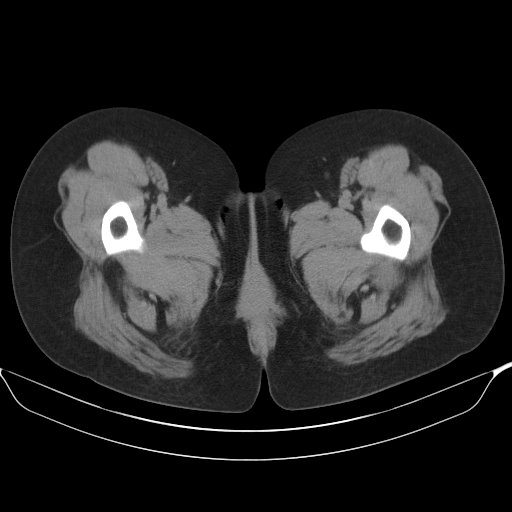
[im 3/44  bone]
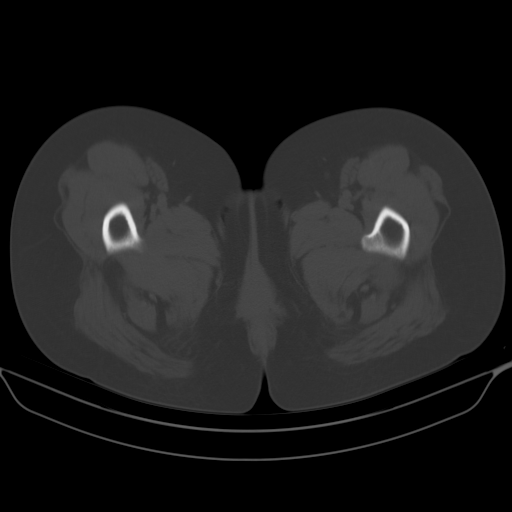
[im 6/44  soft-tissue]
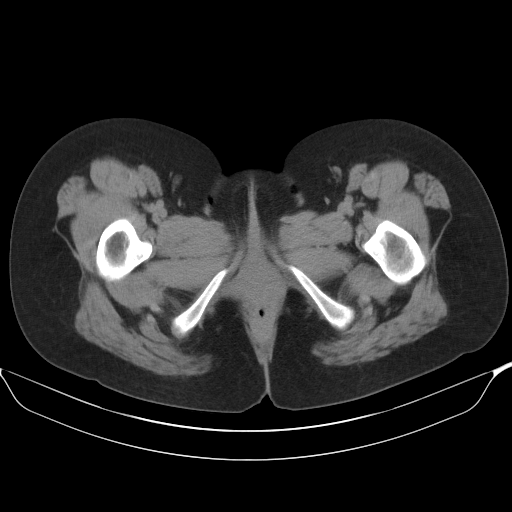
[im 9/44  soft-tissue]
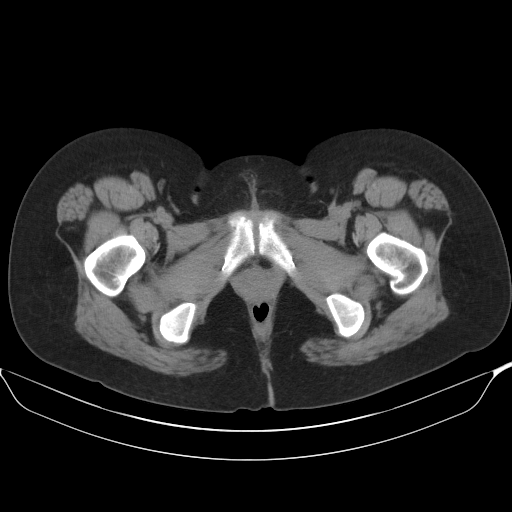
[im 12/44  soft-tissue]
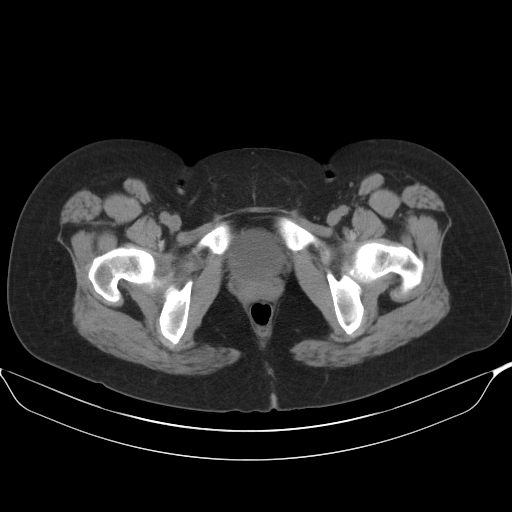
[im 14/44  soft-tissue]
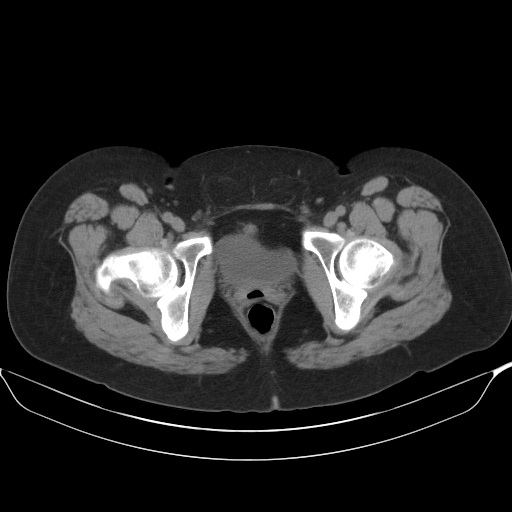
[im 17/44  soft-tissue]
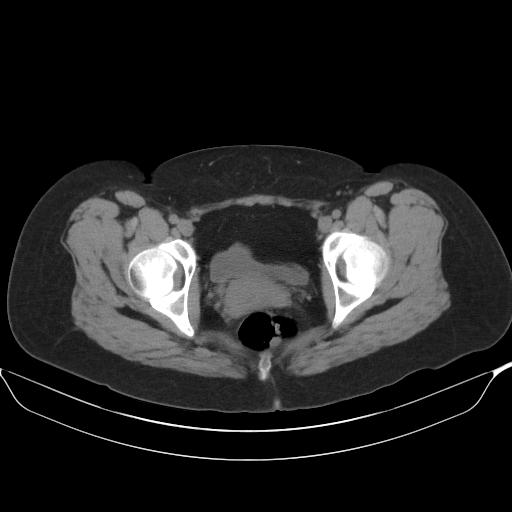
[im 20/44  soft-tissue]
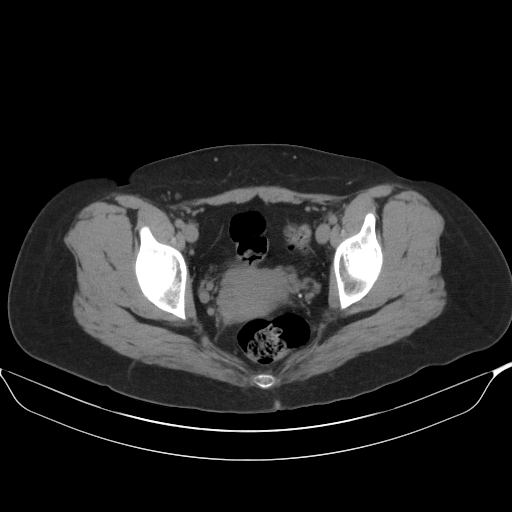
[im 24/44  soft-tissue]
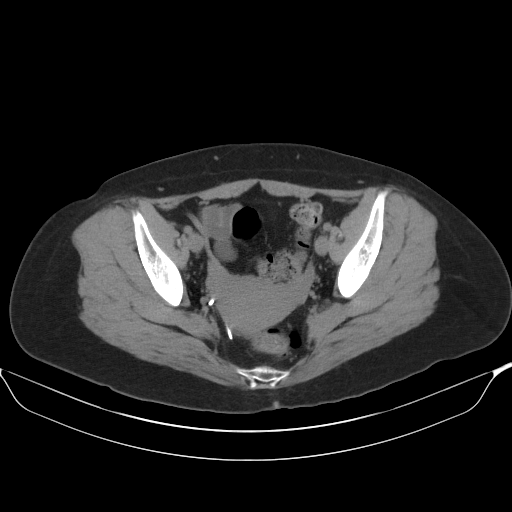
[im 27/44  soft-tissue]
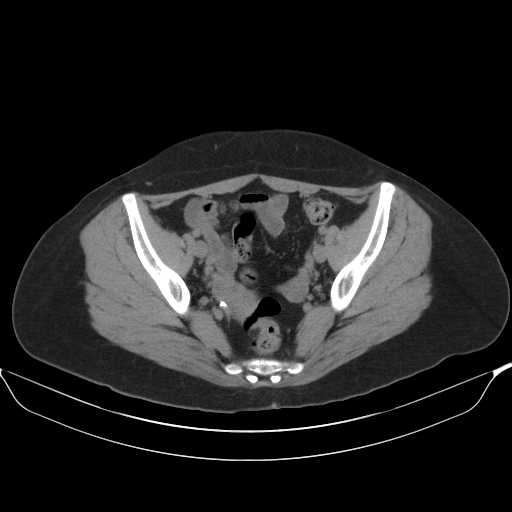
[im 27/44  bone]
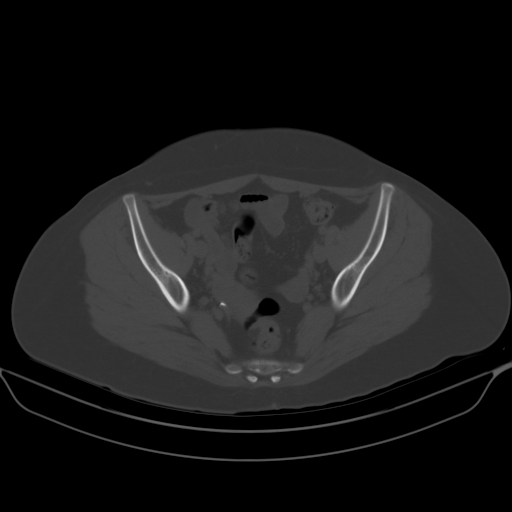
[im 30/44  soft-tissue]
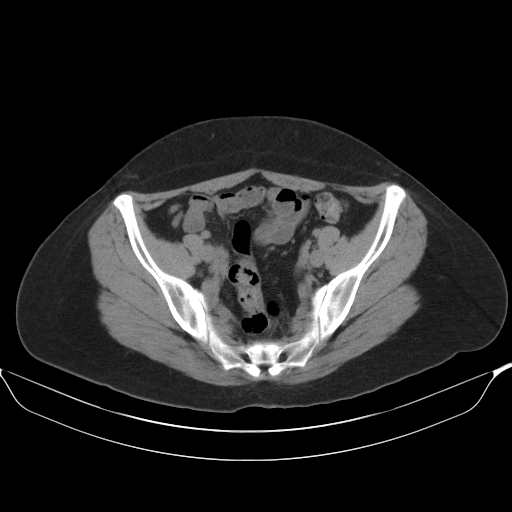
[im 32/44  soft-tissue]
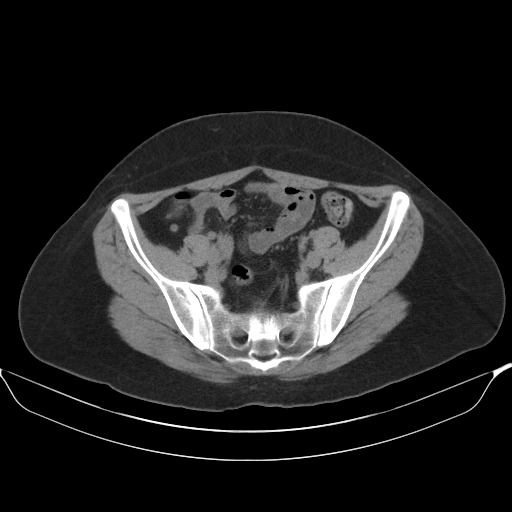
[im 35/44  soft-tissue]
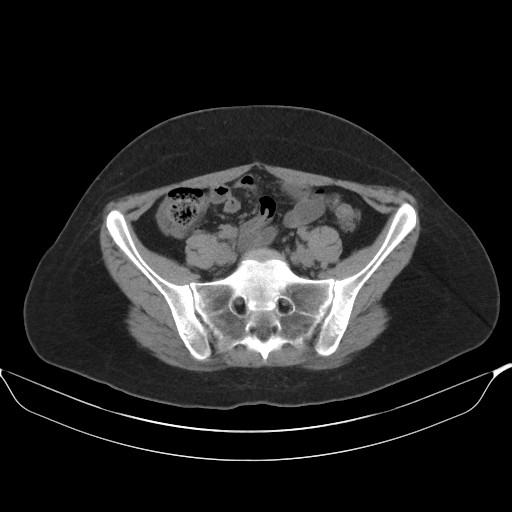
[im 38/44  soft-tissue]
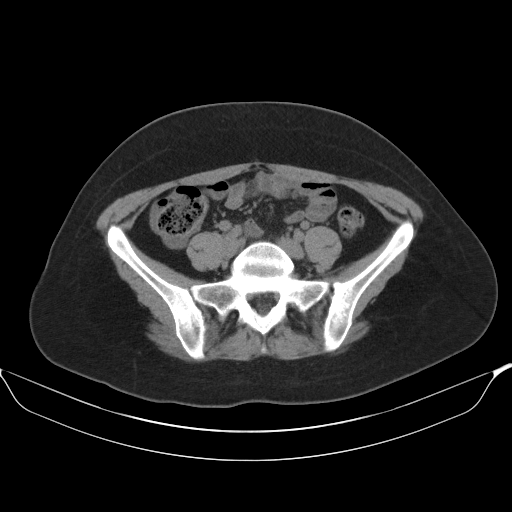
[im 38/44  lung]
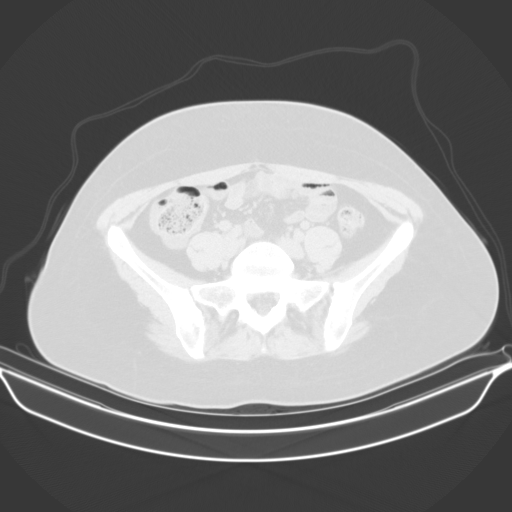
[im 39/44  lung]
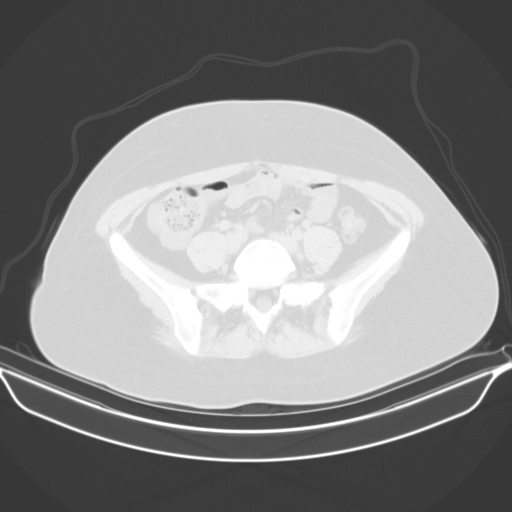
[im 41/44  soft-tissue]
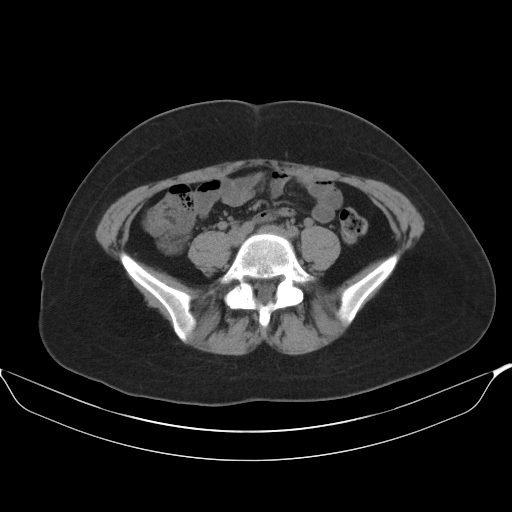
[im 41/44  lung]
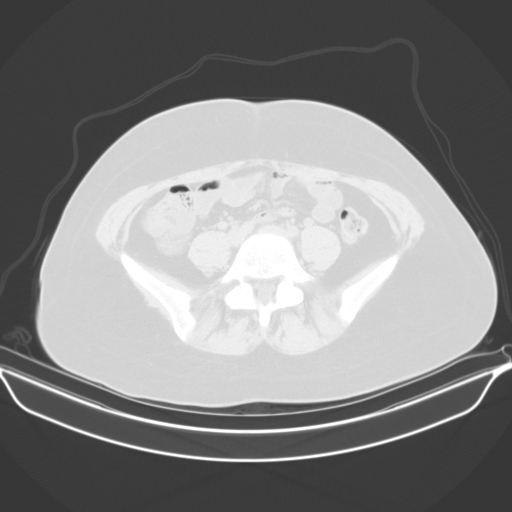
[im 42/44  lung]
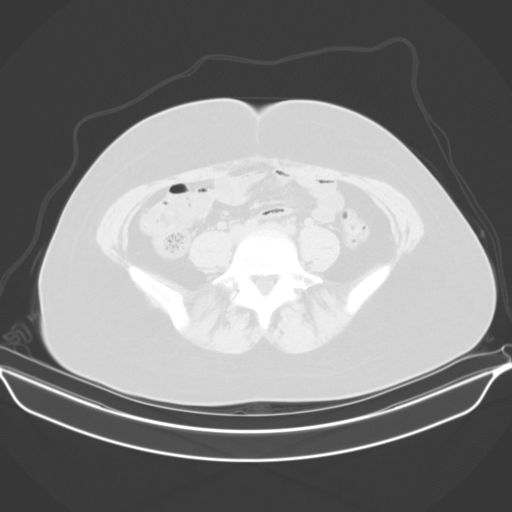

[16 of 32 positions shown; findings below may reference images not displayed]

FINDINGS: Urinary Tract: Unremarkable urinary bladder.

Bowel: Unremarkable pelvic bowel loops.

Vascular/Lymphatic: No pathologically enlarged lymph nodes or other
significant abnormality.

Reproductive: Normal size uterus. IUD seen in the right posterior
adnexal region along the right lateral uterine wall. No adnexal mass
or abnormal fluid collections.

Other: None.

Musculoskeletal: No significant abnormality identified.
IMPRESSION: Abnormal extra-uterine location of IUD in right adnexal region.

Otherwise unremarkable exam.

## 2019-02-05 NOTE — L&D Delivery Note (Signed)
Delivery Note At  a viable and healthy adult was delivered via .  Presentation: vertex; Position: Right,, Occiput,, Anterior; Station: +5.  Delivery of the head: spontaneous   Second maneuver: ,  Mcroberts Third maneuver: ,  Woods screw Fourth maneuver: ,  Delivery of posterior arm    APGAR:8 ,9 ; weight pending .   Placenta status: spontaneous, intact.   Cord:  Spontaneous with the following complications: none .  Cord pH: na  Anesthesia:  epidural Episiotomy: None Lacerations:  none Suture Repair: na Est. Blood Loss (mL):  200  Mom to postpartum.  Baby to Couplet care / Skin to Skin.  Nakaila Freeze J 01/21/2020, 3:06 PM

## 2019-02-08 ENCOUNTER — Other Ambulatory Visit: Payer: Self-pay

## 2019-02-08 ENCOUNTER — Encounter (HOSPITAL_COMMUNITY): Payer: Self-pay

## 2019-02-08 ENCOUNTER — Emergency Department (HOSPITAL_COMMUNITY)
Admission: EM | Admit: 2019-02-08 | Discharge: 2019-02-08 | Disposition: A | Discharge status: Home / Self Care | Payer: 59 | Attending: Emergency Medicine | Admitting: Emergency Medicine

## 2019-02-08 DIAGNOSIS — R112 Nausea with vomiting, unspecified: Secondary | ICD-10-CM | POA: Insufficient documentation

## 2019-02-08 DIAGNOSIS — R197 Diarrhea, unspecified: Secondary | ICD-10-CM | POA: Insufficient documentation

## 2019-02-08 DIAGNOSIS — Z79899 Other long term (current) drug therapy: Secondary | ICD-10-CM | POA: Diagnosis not present

## 2019-02-08 DIAGNOSIS — Z87891 Personal history of nicotine dependence: Secondary | ICD-10-CM | POA: Insufficient documentation

## 2019-02-08 DIAGNOSIS — R1084 Generalized abdominal pain: Secondary | ICD-10-CM

## 2019-02-08 LAB — URINALYSIS, ROUTINE W REFLEX MICROSCOPIC
Bilirubin Urine: NEGATIVE
Glucose, UA: NEGATIVE mg/dL
Hgb urine dipstick: NEGATIVE
Ketones, ur: NEGATIVE mg/dL
Leukocytes,Ua: NEGATIVE
Nitrite: NEGATIVE
Protein, ur: NEGATIVE mg/dL
Specific Gravity, Urine: 1.005 (ref 1.005–1.030)
pH: 6 (ref 5.0–8.0)

## 2019-02-08 LAB — CBC
HCT: 41.4 % (ref 36.0–46.0)
Hemoglobin: 13.8 g/dL (ref 12.0–15.0)
MCH: 29.2 pg (ref 26.0–34.0)
MCHC: 33.3 g/dL (ref 30.0–36.0)
MCV: 87.5 fL (ref 80.0–100.0)
Platelets: 278 10*3/uL (ref 150–400)
RBC: 4.73 MIL/uL (ref 3.87–5.11)
RDW: 12 % (ref 11.5–15.5)
WBC: 9.4 10*3/uL (ref 4.0–10.5)
nRBC: 0 % (ref 0.0–0.2)

## 2019-02-08 LAB — COMPREHENSIVE METABOLIC PANEL
ALT: 32 U/L (ref 0–44)
AST: 21 U/L (ref 15–41)
Albumin: 3.9 g/dL (ref 3.5–5.0)
Alkaline Phosphatase: 64 U/L (ref 38–126)
Anion gap: 8 (ref 5–15)
BUN: 11 mg/dL (ref 6–20)
CO2: 24 mmol/L (ref 22–32)
Calcium: 8.9 mg/dL (ref 8.9–10.3)
Chloride: 106 mmol/L (ref 98–111)
Creatinine, Ser: 0.61 mg/dL (ref 0.44–1.00)
GFR calc Af Amer: >60 mL/min (ref >60)
GFR calc non Af Amer: >60 mL/min (ref >60)
Glucose, Bld: 90 mg/dL (ref 70–99)
Potassium: 4.1 mmol/L (ref 3.5–5.1)
Sodium: 138 mmol/L (ref 135–145)
Total Bilirubin: 0.7 mg/dL (ref 0.3–1.2)
Total Protein: 6.6 g/dL (ref 6.5–8.1)

## 2019-02-08 LAB — I-STAT BETA HCG BLOOD, ED (MC, WL, AP ONLY): I-stat hCG, quantitative: <5 m[IU]/mL (ref <5)

## 2019-02-08 LAB — LIPASE, BLOOD: Lipase: 19 U/L (ref 11–51)

## 2019-02-08 MED ORDER — DICYCLOMINE HCL 20 MG PO TABS: 20.0000 mg | ORAL_TABLET | Freq: Two times a day (BID) | ORAL | 0 refills | Status: DC | PRN

## 2019-02-08 MED ORDER — ONDANSETRON HCL 4 MG/2ML IJ SOLN
4.0000 mg | Freq: Once | INTRAMUSCULAR | Status: AC
  Administered 2019-02-08: 15:00:00 4 mg via INTRAVENOUS
  Filled 2019-02-08: qty 2

## 2019-02-08 MED ORDER — SODIUM CHLORIDE 0.9% FLUSH: 3.0000 mL | Freq: Once | INTRAVENOUS | Status: DC

## 2019-02-08 MED ORDER — ONDANSETRON 4 MG PO TBDP: 4.0000 mg | ORAL_TABLET | Freq: Three times a day (TID) | ORAL | 0 refills | Status: DC | PRN

## 2019-02-08 NOTE — ED Triage Notes (Signed)
Patient complains of vomiting and diarrhea that started last week and for the past 2 days increased bloating and last night had recurrent vomiting and diarrhea

## 2019-02-08 NOTE — ED Provider Notes (Signed)
[facility] EMERGENCY DEPARTMENT Provider Note   CSN: [id number] Arrival date & time: 02/08/19  0846     History Chief Complaint  Patient presents with  . Abdominal Pain    Heather Calderon is a 24 y.o. female w PMHx anxiety, asthma, presenting to the ED with complaint of generalized abdominal pain that began last Tuesday. Pain is mid abdomen radiating to b/l lower abdomen, described as waves of cramping pain. Symptoms initially Improved until Sunday then worsened again last night with the recurrence of cramping pain.  Assoc sx include feeling gassy and bloated with mild nausea and diarrhea. Subjective fevers.  Denies assoc urinary sx, URI sx.  Menstrual period started Tuesday as well.  No history of abdominal surgeries.  The history is provided by the patient.       Past Medical History:  Diagnosis Date  . Anxiety   . Asthma    CHILDHOOD  . Depression     Patient Active Problem List   Diagnosis Date Noted  . High risk pregnancy due to history of preterm labor 05/16/2016    Past Surgical History:  Procedure Laterality Date  . ADENOIDECTOMY    . LAPAROSCOPY N/A 11/11/2016   Procedure: LAPAROSCOPY DIAGNOSTIC/IUD Removal;  Surgeon: Brien Few, MD;  Location: [facility];  Service: Gynecology;  Laterality: N/A;  . TONSILLECTOMY    . WISDOM TOOTH EXTRACTION  2015     OB History    Gravida  2   Para  1   Term  0   Preterm  1   AB  0   Living  1     SAB  0   TAB  0   Ectopic  0   Multiple  0   Live Births  1           Family History  Problem Relation Age of Onset  . Hypertension Maternal Grandfather   . Diabetes Maternal Grandfather   . Heart disease Maternal Grandfather   . Diabetes Paternal Grandfather     Social History   Tobacco Use  . Smoking status: Former Smoker    Packs/day: 0.25    Quit date: 03/07/2013    Years since quitting: 5.9  . Smokeless tobacco: Never Used  Substance Use Topics  . Alcohol use: No  .  Drug use: No    Home Medications Prior to Admission medications   Medication Sig Start Date End Date Taking? Authorizing Provider  busPIRone (BUSPAR) 7.5 MG tablet Take 7.5 mg by mouth 2 (two) times daily. 10/31/16   [provider]  dicyclomine (BENTYL) 20 MG tablet Take 1 tablet (20 mg total) by mouth 2 (two) times daily as needed for spasms. 02/08/19   Jiaire Rosebrook, [place] N, PA-C  HYDROmorphone (DILAUDID) 2 MG tablet One tab every 4hrs as needed 11/11/16   Brien Few, MD  ondansetron (ZOFRAN ODT) 4 MG disintegrating tablet Take 1 tablet (4 mg total) by mouth every 8 (eight) hours as needed for nausea or vomiting. 02/08/19   Jailyne Chieffo, [place] N, PA-C  oxyCODONE-acetaminophen (ROXICET) 5-325 MG tablet Take 1-2 tablets by mouth every 4 (four) hours as needed. 11/11/16   Brien Few, MD    Allergies    Coconut flavor, Hydrocodone, Other, Oxycodone, and Sulfa antibiotics  Review of Systems   Review of Systems  Gastrointestinal: Positive for abdominal pain, diarrhea and nausea.  All other systems reviewed and are negative.   Physical Exam Updated Vital Signs BP 97/65 (BP Location: Left  Arm)   Pulse 77   Temp 98.1 F (36.7 C) (Oral)   Resp 17   SpO2 100%   Physical Exam Vitals and nursing note reviewed.  Constitutional:      General: She is not in acute distress.    Appearance: She is well-developed. She is not ill-appearing.  HENT:     Head: Normocephalic and atraumatic.  Eyes:     Conjunctiva/sclera: Conjunctivae normal.  Cardiovascular:     Rate and Rhythm: Normal rate and regular rhythm.  Pulmonary:     Effort: Pulmonary effort is normal. No respiratory distress.     Breath sounds: Normal breath sounds.  Abdominal:     General: Abdomen is flat. Bowel sounds are normal.     Palpations: Abdomen is soft.     Tenderness: There is generalized abdominal tenderness. There is no guarding or rebound.  Skin:    General: Skin is warm.  Neurological:     Mental Status:  She is alert.  Psychiatric:        Behavior: Behavior normal.     ED Results / Procedures / Treatments   Labs (all labs ordered are listed, but only abnormal results are displayed) Labs Reviewed  URINALYSIS, ROUTINE W REFLEX MICROSCOPIC - Abnormal; Notable for the following components:      Result Value   Color, Urine STRAW (*)    All other components within normal limits  LIPASE, BLOOD  COMPREHENSIVE METABOLIC PANEL  CBC  I-STAT BETA HCG BLOOD, ED ([facility], [facility], [facility] ONLY)    EKG None  Radiology No results found.  Procedures Procedures (including critical care time)  Medications Ordered in ED Medications  sodium chloride flush (NS) 0.9 % injection 3 mL (has no administration in time range)  ondansetron (ZOFRAN) injection 4 mg (has no administration in time range)    ED Course  I have reviewed the triage vital signs and the nursing notes.  Pertinent labs & imaging results that were available during my care of the patient were reviewed by me and considered in my medical decision making (see chart for details).    MDM Rules/Calculators/A&P                      Patient with intermittent cramping generalized abd pain with diarrhea and nausea. Symptoms suspicious viral gastroenteritis.  Vitals are stable, no fever.  No signs of dehydration, tolerating PO fluids > 6 oz.  Lungs are clear.  No focal abdominal pain, no concern for appendicitis, cholecystitis, pancreatitis, ruptured viscus, UTI, kidney stone, or any other abdominal etiology. Labs obtained in triage are normal; no leukocytosis, no electrolyte derangements, nl lipase, neg urine, neg preg. Shared decision making with pt regarding further workup. Pt agreeable with plan for discharge with supportive therapy and PCP f/u. Return precautions discussed. Pt safe for discharge.  Discussed results, findings, treatment and follow up. Patient advised of return precautions. Patient verbalized understanding and agreed with plan.  Final  Clinical Impression(s) / ED Diagnoses Final diagnoses:  Generalized abdominal pain  Nausea vomiting and diarrhea    Rx / DC Orders ED Discharge Orders         Ordered    dicyclomine (BENTYL) 20 MG tablet  2 times daily PRN     02/08/19 1417    ondansetron (ZOFRAN ODT) 4 MG disintegrating tablet  Every 8 hours PRN     02/08/19 1417           Kush Farabee, [place] N, PA-C 02/08/19  1432    Tegeler, Canary Brim, MD 02/08/19 [id number]

## 2019-02-08 NOTE — Discharge Instructions (Addendum)
Please read instructions below. Drink clear liquids until your stomach feels better. Then, slowly introduce bland foods into your diet as tolerated, such as bread, rice, apples, bananas. You can take zofran every 8 hours as needed for nausea. You can take the bentyl/dicyclomine every 12 hours for abdominal cramping. Follow up with your primary care if symptoms persist. Return to the ER for severe abdominal pain, fever, uncontrollable vomiting, or new or concerning symptoms.

## 2019-07-01 LAB — OB RESULTS CONSOLE RUBELLA ANTIBODY, IGM: Rubella: NON-IMMUNE/NOT IMMUNE

## 2019-07-01 LAB — OB RESULTS CONSOLE ABO/RH: RH Type: POSITIVE

## 2019-07-01 LAB — OB RESULTS CONSOLE HIV ANTIBODY (ROUTINE TESTING): HIV: NONREACTIVE

## 2019-07-01 LAB — OB RESULTS CONSOLE ANTIBODY SCREEN: Antibody Screen: NEGATIVE

## 2019-07-01 LAB — OB RESULTS CONSOLE GC/CHLAMYDIA
Chlamydia: NEGATIVE
Gonorrhea: NEGATIVE

## 2019-07-01 LAB — OB RESULTS CONSOLE RPR: RPR: NONREACTIVE

## 2019-07-01 LAB — OB RESULTS CONSOLE HEPATITIS B SURFACE ANTIGEN: Hepatitis B Surface Ag: NEGATIVE

## 2019-12-28 LAB — OB RESULTS CONSOLE GBS: GBS: NEGATIVE

## 2020-01-14 ENCOUNTER — Telehealth (HOSPITAL_COMMUNITY): Payer: Self-pay | Admitting: *Deleted

## 2020-01-14 ENCOUNTER — Encounter (HOSPITAL_COMMUNITY): Payer: Self-pay | Admitting: *Deleted

## 2020-01-14 NOTE — Telephone Encounter (Signed)
Preadmission screen  

## 2020-01-17 ENCOUNTER — Encounter (HOSPITAL_COMMUNITY): Payer: Self-pay | Admitting: *Deleted

## 2020-01-17 ENCOUNTER — Telehealth (HOSPITAL_COMMUNITY): Payer: Self-pay | Admitting: *Deleted

## 2020-01-17 NOTE — Telephone Encounter (Signed)
Preadmission screen  

## 2020-01-18 ENCOUNTER — Other Ambulatory Visit: Payer: Self-pay | Admitting: Obstetrics and Gynecology

## 2020-01-21 ENCOUNTER — Other Ambulatory Visit: Payer: Self-pay

## 2020-01-21 ENCOUNTER — Inpatient Hospital Stay (HOSPITAL_COMMUNITY): Payer: 59

## 2020-01-21 ENCOUNTER — Encounter (HOSPITAL_COMMUNITY): Payer: Self-pay | Admitting: Obstetrics and Gynecology

## 2020-01-21 ENCOUNTER — Inpatient Hospital Stay (HOSPITAL_COMMUNITY): Payer: 59 | Admitting: Anesthesiology

## 2020-01-21 ENCOUNTER — Inpatient Hospital Stay (HOSPITAL_COMMUNITY)
Admission: AD | Admit: 2020-01-21 | Discharge: 2020-01-22 | DRG: 807 | Disposition: A | Discharge status: Home / Self Care | Payer: 59 | Attending: Obstetrics and Gynecology | Admitting: Obstetrics and Gynecology

## 2020-01-21 DIAGNOSIS — Z3A39 39 weeks gestation of pregnancy: Secondary | ICD-10-CM | POA: Diagnosis not present

## 2020-01-21 DIAGNOSIS — O365930 Maternal care for other known or suspected poor fetal growth, third trimester, not applicable or unspecified: Principal | ICD-10-CM | POA: Diagnosis present

## 2020-01-21 DIAGNOSIS — Z20822 Contact with and (suspected) exposure to covid-19: Secondary | ICD-10-CM | POA: Diagnosis present

## 2020-01-21 DIAGNOSIS — Z3490 Encounter for supervision of normal pregnancy, unspecified, unspecified trimester: Secondary | ICD-10-CM | POA: Diagnosis present

## 2020-01-21 DIAGNOSIS — Z87891 Personal history of nicotine dependence: Secondary | ICD-10-CM

## 2020-01-21 LAB — TYPE AND SCREEN
ABO/RH(D): O POS
Antibody Screen: NEGATIVE

## 2020-01-21 LAB — CBC
HCT: 36.8 % (ref 36.0–46.0)
Hemoglobin: 11.9 g/dL — ABNORMAL LOW (ref 12.0–15.0)
MCH: 26.7 pg (ref 26.0–34.0)
MCHC: 32.3 g/dL (ref 30.0–36.0)
MCV: 82.7 fL (ref 80.0–100.0)
Platelets: 226 10*3/uL (ref 150–400)
RBC: 4.45 MIL/uL (ref 3.87–5.11)
RDW: 14.2 % (ref 11.5–15.5)
WBC: 12.1 10*3/uL — ABNORMAL HIGH (ref 4.0–10.5)
nRBC: 0 % (ref 0.0–0.2)

## 2020-01-21 LAB — RESP PANEL BY RT-PCR (FLU A&B, COVID) ARPGX2
Influenza A by PCR: NEGATIVE
Influenza B by PCR: NEGATIVE
SARS Coronavirus 2 by RT PCR: NEGATIVE

## 2020-01-21 LAB — RPR: RPR Ser Ql: NONREACTIVE

## 2020-01-21 MED ORDER — DIPHENHYDRAMINE HCL 50 MG/ML IJ SOLN: 12.5000 mg | INTRAMUSCULAR | Status: DC | PRN

## 2020-01-21 MED ORDER — SIMETHICONE 80 MG PO CHEW: 80.0000 mg | CHEWABLE_TABLET | ORAL | Status: DC | PRN

## 2020-01-21 MED ORDER — BENZOCAINE-MENTHOL 20-0.5 % EX AERO
1.0000 | INHALATION_SPRAY | CUTANEOUS | Status: DC | PRN
Start: 2020-01-21 — End: 2020-01-22

## 2020-01-21 MED ORDER — LACTATED RINGERS IV SOLN
500.0000 mL | Freq: Once | INTRAVENOUS | Status: AC
  Administered 2020-01-21: 12:00:00 500 mL via INTRAVENOUS

## 2020-01-21 MED ORDER — LIDOCAINE HCL (PF) 1 % IJ SOLN: 30.0000 mL | INTRAMUSCULAR | Status: DC | PRN

## 2020-01-21 MED ORDER — DIBUCAINE (PERIANAL) 1 % EX OINT: 1.0000 "application " | TOPICAL_OINTMENT | CUTANEOUS | Status: DC | PRN

## 2020-01-21 MED ORDER — ONDANSETRON HCL 4 MG/2ML IJ SOLN: 4.0000 mg | INTRAMUSCULAR | Status: DC | PRN

## 2020-01-21 MED ORDER — METHYLERGONOVINE MALEATE 0.2 MG PO TABS: 0.2000 mg | ORAL_TABLET | ORAL | Status: DC | PRN

## 2020-01-21 MED ORDER — SODIUM CHLORIDE (PF) 0.9 % IJ SOLN
INTRAMUSCULAR | Status: DC | PRN
  Administered 2020-01-21: 12 mL/h via EPIDURAL

## 2020-01-21 MED ORDER — OXYTOCIN BOLUS FROM INFUSION
333.0000 mL | Freq: Once | INTRAVENOUS | Status: DC
  Administered 2020-01-21: 15:00:00 333 mL via INTRAVENOUS

## 2020-01-21 MED ORDER — FENTANYL-BUPIVACAINE-NACL 0.5-0.125-0.9 MG/250ML-% EP SOLN
12.0000 mL/h | EPIDURAL | Status: DC | PRN
  Filled 2020-01-21: qty 250

## 2020-01-21 MED ORDER — IBUPROFEN 600 MG PO TABS
600.0000 mg | ORAL_TABLET | Freq: Four times a day (QID) | ORAL | Status: DC
  Administered 2020-01-21 – 2020-01-22 (×3): 600 mg via ORAL
  Filled 2020-01-21 (×4): qty 1

## 2020-01-21 MED ORDER — SENNOSIDES-DOCUSATE SODIUM 8.6-50 MG PO TABS
2.0000 | ORAL_TABLET | Freq: Every day | ORAL | Status: DC
  Filled 2020-01-21: qty 2

## 2020-01-21 MED ORDER — PHENYLEPHRINE 40 MCG/ML (10ML) SYRINGE FOR IV PUSH (FOR BLOOD PRESSURE SUPPORT)
80.0000 ug | PREFILLED_SYRINGE | INTRAVENOUS | Status: DC | PRN
  Filled 2020-01-21: qty 10

## 2020-01-21 MED ORDER — TERBUTALINE SULFATE 1 MG/ML IJ SOLN: 0.2500 mg | Freq: Once | INTRAMUSCULAR | Status: DC | PRN

## 2020-01-21 MED ORDER — OXYCODONE-ACETAMINOPHEN 5-325 MG PO TABS: 2.0000 | ORAL_TABLET | ORAL | Status: DC | PRN

## 2020-01-21 MED ORDER — ZOLPIDEM TARTRATE 5 MG PO TABS: 5.0000 mg | ORAL_TABLET | Freq: Every evening | ORAL | Status: DC | PRN

## 2020-01-21 MED ORDER — COCONUT OIL OIL: 1.0000 "application " | TOPICAL_OIL | Status: DC | PRN

## 2020-01-21 MED ORDER — METHYLERGONOVINE MALEATE 0.2 MG/ML IJ SOLN: 0.2000 mg | INTRAMUSCULAR | Status: DC | PRN

## 2020-01-21 MED ORDER — DIPHENHYDRAMINE HCL 25 MG PO CAPS: 25.0000 mg | ORAL_CAPSULE | Freq: Four times a day (QID) | ORAL | Status: DC | PRN

## 2020-01-21 MED ORDER — WITCH HAZEL-GLYCERIN EX PADS: 1.0000 "application " | MEDICATED_PAD | CUTANEOUS | Status: DC | PRN

## 2020-01-21 MED ORDER — OXYCODONE-ACETAMINOPHEN 5-325 MG PO TABS: 1.0000 | ORAL_TABLET | ORAL | Status: DC | PRN

## 2020-01-21 MED ORDER — EPHEDRINE 5 MG/ML INJ: 10.0000 mg | INTRAVENOUS | Status: DC | PRN

## 2020-01-21 MED ORDER — TETANUS-DIPHTH-ACELL PERTUSSIS 5-2.5-18.5 LF-MCG/0.5 IM SUSY: 0.5000 mL | PREFILLED_SYRINGE | Freq: Once | INTRAMUSCULAR | Status: DC

## 2020-01-21 MED ORDER — ONDANSETRON HCL 4 MG PO TABS: 4.0000 mg | ORAL_TABLET | ORAL | Status: DC | PRN

## 2020-01-21 MED ORDER — ACETAMINOPHEN 325 MG PO TABS
650.0000 mg | ORAL_TABLET | ORAL | Status: DC | PRN
  Administered 2020-01-22: 650 mg via ORAL
  Filled 2020-01-21: qty 2

## 2020-01-21 MED ORDER — OXYTOCIN-SODIUM CHLORIDE 30-0.9 UT/500ML-% IV SOLN: 2.5000 [IU]/h | INTRAVENOUS | Status: DC

## 2020-01-21 MED ORDER — PRENATAL MULTIVITAMIN CH
1.0000 | ORAL_TABLET | Freq: Every day | ORAL | Status: DC
  Administered 2020-01-22: 11:00:00 1 via ORAL
  Filled 2020-01-21: qty 1

## 2020-01-21 MED ORDER — ACETAMINOPHEN 325 MG PO TABS: 650.0000 mg | ORAL_TABLET | ORAL | Status: DC | PRN

## 2020-01-21 MED ORDER — ONDANSETRON HCL 4 MG/2ML IJ SOLN: 4.0000 mg | Freq: Four times a day (QID) | INTRAMUSCULAR | Status: DC | PRN

## 2020-01-21 MED ORDER — LACTATED RINGERS IV SOLN
INTRAVENOUS | Status: DC
  Administered 2020-01-21: 08:00:00 125 mL/h via INTRAVENOUS

## 2020-01-21 MED ORDER — LIDOCAINE HCL (PF) 1 % IJ SOLN
INTRAMUSCULAR | Status: DC | PRN
  Administered 2020-01-21: 2 mL via EPIDURAL
  Administered 2020-01-21: 10 mL via EPIDURAL

## 2020-01-21 MED ORDER — BUSPIRONE HCL 15 MG PO TABS
7.5000 mg | ORAL_TABLET | Freq: Two times a day (BID) | ORAL | Status: DC
  Administered 2020-01-21 – 2020-01-22 (×2): 7.5 mg via ORAL
  Filled 2020-01-21 (×4): qty 1

## 2020-01-21 MED ORDER — OXYTOCIN-SODIUM CHLORIDE 30-0.9 UT/500ML-% IV SOLN
1.0000 m[IU]/min | INTRAVENOUS | Status: DC
  Administered 2020-01-21: 08:00:00 2 m[IU]/min via INTRAVENOUS
  Filled 2020-01-21: qty 500

## 2020-01-21 MED ORDER — PHENYLEPHRINE 40 MCG/ML (10ML) SYRINGE FOR IV PUSH (FOR BLOOD PRESSURE SUPPORT)
80.0000 ug | PREFILLED_SYRINGE | INTRAVENOUS | Status: DC | PRN
  Administered 2020-01-21 (×2): 80 ug via INTRAVENOUS

## 2020-01-21 MED ORDER — SOD CITRATE-CITRIC ACID 500-334 MG/5ML PO SOLN: 30.0000 mL | ORAL | Status: DC | PRN

## 2020-01-21 MED ORDER — LACTATED RINGERS IV SOLN: 500.0000 mL | INTRAVENOUS | Status: DC | PRN

## 2020-01-21 NOTE — Anesthesia Procedure Notes (Signed)
Epidural Patient location during procedure: OB Start time: 01/21/2020 12:38 PM End time: 01/21/2020 12:47 PM  Staffing Anesthesiologist: Lannie Fields, DO Performed: anesthesiologist   Preanesthetic Checklist Completed: patient identified, IV checked, risks and benefits discussed, monitors and equipment checked, pre-op evaluation and timeout performed  Epidural Patient position: sitting Prep: DuraPrep and site prepped and draped Patient monitoring: continuous pulse ox, blood pressure, heart rate and cardiac monitor Approach: midline Location: L3-L4 Injection technique: LOR air  Needle:  Needle type: Tuohy  Needle gauge: 17 G Needle length: 9 cm Needle insertion depth: 5 cm Catheter type: closed end flexible Catheter size: 19 Gauge Catheter at skin depth: 10 cm Test dose: negative  Assessment Sensory level: T8 Events: blood not aspirated, injection not painful, no injection resistance, no paresthesia and negative IV test  Additional Notes Patient identified. Risks/Benefits/Options discussed with patient including but not limited to bleeding, infection, nerve damage, paralysis, failed block, incomplete pain control, headache, blood pressure changes, nausea, vomiting, reactions to medication both or allergic, itching and postpartum back pain. Confirmed with bedside nurse the patient's most recent platelet count. Confirmed with patient that they are not currently taking any anticoagulation, have any bleeding history or any family history of bleeding disorders. Patient expressed understanding and wished to proceed. All questions were answered. Sterile technique was used throughout the entire procedure. Please see nursing notes for vital signs. Test dose was given through epidural catheter and negative prior to continuing to dose epidural or start infusion. Warning signs of high block given to the patient including shortness of breath, tingling/numbness in hands, complete motor  block, or any concerning symptoms with instructions to call for help. Patient was given instructions on fall risk and not to get out of bed. All questions and concerns addressed with instructions to call with any issues or inadequate analgesia.  Reason for block:procedure for pain

## 2020-01-21 NOTE — Progress Notes (Signed)
Pt does not want to be seen by lactation at this time.   Tylene Fantasia, RN

## 2020-01-21 NOTE — Anesthesia Preprocedure Evaluation (Signed)
Anesthesia Evaluation  Patient identified by MRN, date of birth, ID band Patient awake    Reviewed: Allergy & Precautions, Patient's Chart, lab work & pertinent test results  Airway Mallampati: II  TM Distance: >3 FB Neck ROM: Full    Dental no notable dental hx.    Pulmonary asthma , former smoker,    Pulmonary exam normal breath sounds clear to auscultation       Cardiovascular negative cardio ROS Normal cardiovascular exam Rhythm:Regular Rate:Normal     Neuro/Psych PSYCHIATRIC DISORDERS Anxiety Depression negative neurological ROS     GI/Hepatic negative GI ROS, Neg liver ROS,   Endo/Other  negative endocrine ROS  Renal/GU negative Renal ROS  negative genitourinary   Musculoskeletal negative musculoskeletal ROS (+)   Abdominal   Peds negative pediatric ROS (+)  Hematology negative hematology ROS (+)   Anesthesia Other Findings   Reproductive/Obstetrics (+) Pregnancy                             Anesthesia Physical Anesthesia Plan  ASA: II and emergent  Anesthesia Plan: Epidural   Post-op Pain Management:    Induction:   PONV Risk Score and Plan: 2  Airway Management Planned: Natural Airway  Additional Equipment: None  Intra-op Plan:   Post-operative Plan:   Informed Consent: I have reviewed the patients History and Physical, chart, labs and discussed the procedure including the risks, benefits and alternatives for the proposed anesthesia with the patient or authorized representative who has indicated his/her understanding and acceptance.       Plan Discussed with:   Anesthesia Plan Comments:         Anesthesia Quick Evaluation

## 2020-01-21 NOTE — H&P (Signed)
FREDRIKA CANBY is a 24 y.o. female presenting for IOL for IUGR. OB History    Gravida  3   Para  1   Term  0   Preterm  1   AB  0   Living  1     SAB  0   IAB  0   Ectopic  0   Multiple  0   Live Births  1          Past Medical History:  Diagnosis Date  . Anxiety   . Asthma    CHILDHOOD  . Depression    Past Surgical History:  Procedure Laterality Date  . ADENOIDECTOMY    . LAPAROSCOPY N/A 11/11/2016   Procedure: LAPAROSCOPY DIAGNOSTIC/IUD Removal;  Surgeon: Olivia Mackie, MD;  Location: [facility];  Service: Gynecology;  Laterality: N/A;  . TONSILLECTOMY    . WISDOM TOOTH EXTRACTION  2015   Family History: family history includes Diabetes in her maternal grandfather and paternal grandfather; Heart disease in her maternal grandfather; Hypertension in her maternal grandfather. Social History:  reports that she quit smoking about 6 years ago. She smoked 0.25 packs per day. She has never used smokeless tobacco. She reports that she does not drink alcohol and does not use drugs.     Maternal Diabetes: No Genetic Screening: Normal Maternal Ultrasounds/Referrals: Normal Fetal Ultrasounds or other Referrals:  None Maternal Substance Abuse:  No Significant Maternal Medications:  None Significant Maternal Lab Results:  Group B Strep negative Other Comments:  None  Review of Systems  Constitutional: Negative.   All other systems reviewed and are negative.  Maternal Medical History:  Reason for admission: Contractions.   Contractions: Onset was less than 1 hour ago.   Frequency: regular.   Perceived severity is mild.    Fetal activity: Perceived fetal activity is normal.    Prenatal complications: IUGR.   Prenatal Complications - Diabetes: none.    Exam by:: Dr Billy Coast Blood pressure 113/75, pulse 70, temperature 98.6 F (37 C), temperature source Oral, resp. rate 18, height 5\' 2"  (1.575 m), weight 74.8 kg, SpO2 98 %, currently  breastfeeding. Exam Physical Exam  Prenatal labs: ABO, Rh: --/--/PENDING (12/17 0800) Antibody: PENDING (12/17 0800) Rubella: Nonimmune (05/27 0000) RPR: Nonreactive (05/27 0000)  HBsAg: Negative (05/27 0000)  HIV: Non-reactive (05/27 0000)  GBS:     Assessment/Plan: 39wk IUP IUGR vs SGA IOL   Emree Locicero J 01/21/2020, 8:47 AM

## 2020-01-22 LAB — CBC
HCT: 31.5 % — ABNORMAL LOW (ref 36.0–46.0)
Hemoglobin: 10.8 g/dL — ABNORMAL LOW (ref 12.0–15.0)
MCH: 27.7 pg (ref 26.0–34.0)
MCHC: 34.3 g/dL (ref 30.0–36.0)
MCV: 80.8 fL (ref 80.0–100.0)
Platelets: 190 10*3/uL (ref 150–400)
RBC: 3.9 MIL/uL (ref 3.87–5.11)
RDW: 14.5 % (ref 11.5–15.5)
WBC: 16.7 10*3/uL — ABNORMAL HIGH (ref 4.0–10.5)
nRBC: 0 % (ref 0.0–0.2)

## 2020-01-22 MED ORDER — ACETAMINOPHEN 325 MG PO TABS: 650.0000 mg | ORAL_TABLET | ORAL | Status: DC | PRN

## 2020-01-22 MED ORDER — IBUPROFEN 600 MG PO TABS: 600.0000 mg | ORAL_TABLET | Freq: Four times a day (QID) | ORAL | 0 refills | Status: DC

## 2020-01-22 NOTE — Progress Notes (Signed)
MOB was referred for history of depression/anxiety. * Referral screened out by Clinical Social Worker because none of the following criteria appear to apply: ~ History of anxiety/depression during this pregnancy, or of post-partum depression following prior delivery. ~ Diagnosis of anxiety and/or depression within last 3 years. OR * MOB's symptoms currently being treated with medication and/or therapy. MOB has an active Rx for Buspar. No concerns noted in OB records.   Please contact the Clinical Social Worker if needs arise, by [facility] request, or if MOB scores greater than 9/yes to question 10 on Edinburgh Postpartum Depression Screen.  Blaine Hamper, MSW, LCSW Clinical Social Work [phone]

## 2020-01-22 NOTE — Anesthesia Postprocedure Evaluation (Signed)
Anesthesia Post Note  Patient: Heather Calderon  Procedure(s) Performed: AN AD HOC LABOR EPIDURAL     Patient location during evaluation: Mother Baby Anesthesia Type: Epidural Level of consciousness: awake and alert Pain management: pain level controlled Vital Signs Assessment: post-procedure vital signs reviewed and stable Respiratory status: spontaneous breathing Cardiovascular status: stable Postop Assessment: no headache, adequate PO intake, no backache, patient able to bend at knees, able to ambulate, epidural receding and no apparent nausea or vomiting Anesthetic complications: no   No complications documented.  Last Vitals:  Vitals:   01/22/20 0100 01/22/20 0508  BP: (!) 103/56 99/60  Pulse: 60 63  Resp: 18 18  Temp: 36.8 C 36.6 C  SpO2: 99% 99%    Last Pain:  Vitals:   01/22/20 0513  TempSrc:   PainSc: 1    Pain Goal:                   Salome Arnt

## 2020-01-22 NOTE — Discharge Summary (Signed)
Postpartum Discharge Summary   Patient Name: Heather Calderon DOB: 01-13-1996 MRN: [id number]  Date of admission: 01/21/2020 Delivery date:01/21/2020  Delivering provider: Olivia Mackie  Date of discharge: 01/22/2020  Admitting diagnosis: Encounter for induction of labor [Z34.90] Intrauterine pregnancy: [redacted]w[redacted]d, fetal growth restriction in pregnancy but improved at last sono                                           Post partum procedures:none Augmentation: AROM and Pitocin Complications: None  Hospital course: IOL With Vaginal Delivery      24 y.o. yo [id number] at [redacted]w[redacted]d was admitted in for labor induction on 01/21/2020. Patient had an uncomplicated labor course as follows:  Membrane Rupture Time/Date: 9:17 AM ,01/21/2020   Delivery Method:Vaginal, Spontaneous  Episiotomy: None  Lacerations:  None  Patient had an uncomplicated postpartum course.  She is ambulating, tolerating a regular diet, passing flatus, and urinating well. Patient is discharged home in stable condition on 01/22/20.  Newborn Data: Birth date:01/21/2020  Birth time:2:56 PM  Gender:Female  Living status:Living  Apgars:7 ,9  Weight:3515 g    Physical exam  Vitals:   01/21/20 2112 01/22/20 0100 01/22/20 0508 01/22/20 1542  BP: 101/61 (!) 103/56 99/60 106/77  Pulse: 72 60 63 66  Resp: 18 18 18 18   Temp: 98.2 F (36.8 C) 98.3 F (36.8 C) 97.9 F (36.6 C) 98 F (36.7 C)  TempSrc:  Oral Oral Oral  SpO2: 99% 99% 99% 95%  Weight:      Height:       General: alert and cooperative Lochia: appropriate Uterine Fundus: firm Incision: N/A DVT Evaluation: No evidence of DVT seen on physical exam. Labs: Lab Results  Component Value Date   WBC 16.7 (H) 01/22/2020   HGB 10.8 (L) 01/22/2020   HCT 31.5 (L) 01/22/2020   MCV 80.8 01/22/2020   PLT 190 01/22/2020   CMP Latest Ref Rng & Units 02/08/2019  Glucose 70 - 99 mg/dL 90  BUN 6 - 20 mg/dL 11  Creatinine 04/08/2019 - 2.42 mg/dL 6.83  Sodium 4.19 - 622  mmol/L 138  Potassium 3.5 - 5.1 mmol/L 4.1  Chloride 98 - 111 mmol/L 106  CO2 22 - 32 mmol/L 24  Calcium 8.9 - 10.3 mg/dL 8.9  Total Protein 6.5 - 8.1 g/dL 6.6  Total Bilirubin 0.3 - 1.2 mg/dL 0.7  Alkaline Phos 38 - 126 U/L 64  AST 15 - 41 U/L 21  ALT 0 - 44 U/L 32   Edinburgh Score: Edinburgh Postnatal Depression Scale Screening Tool 01/21/2020  I have been able to laugh and see the funny side of things. 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 Did counseling with [facility] and also saw a Psychiatrist. Took Buspar, then add Zoloft and several other meds that she tapered off after a year or so and was only taking Vyvance before this pregnancy. Reviewed recurrent risk, warning s/s and can start a medication sooner than later. Didn't do well with Sertraline. But Buspar helped panic attacks. So sending in Rx for Buspar but she'll call 01/23/2020 back when starts and she reports taking some EPDS forms at home and can chart for objective assessment and call if higher than 9. She also had good experience with [facility] and her Psychiatrist and will call them back as needed. Husband reports being more knowledgeable about her symptoms and has great support.             After visit meds:  Allergies as of 01/22/2020      Reactions   Coconut Flavor Shortness Of Breath   Hydrocodone Shortness Of Breath   Other Itching, Swelling, Other (See Comments)   Tree nuts, melons, bananas   Oxycodone    CHEST TIGHTNESS   Sulfa Antibiotics Hives      Medication List    STOP taking these medications   dicyclomine 20 MG tablet Commonly known as: BENTYL   HYDROmorphone 2 MG tablet Commonly known as: Dilaudid   ondansetron 4 MG disintegrating tablet Commonly known as: Zofran ODT   oxyCODONE-acetaminophen 5-325 MG tablet Commonly known as: Roxicet     TAKE these medications   acetaminophen 325 MG tablet Commonly known as:  Tylenol Take 2 tablets (650 mg total) by mouth every 4 (four) hours as needed (for pain scale < 4).   busPIRone 7.5 MG tablet Commonly known as: BUSPAR Take 7.5 mg by mouth 2 (two) times daily.   ibuprofen 600 MG tablet Commonly known as: ADVIL Take 1 tablet (600 mg total) by mouth every 6 (six) hours.        Discharge home in stable condition Infant Feeding: Breast Infant Disposition:home with mother Discharge instruction: per After Visit Summary and Postpartum booklet. Activity: Advance as tolerated. Pelvic rest for 6 weeks.  Diet: routine diet Future Appointments:No future appointments. Follow up Visit:   Please schedule this patient for a In person postpartum visit in 6 weeks with the following provider: Dr Billy Coast and sooner for PP depression. Additional Postpartum F/U:Postpartum Depression checkup  High risk pregnancy complicated by: PP Depression hx.  Delivery mode:  Vaginal, Spontaneous  Anticipated Birth Control:  Unsure   01/22/2020 Robley Fries, MD

## 2020-01-22 NOTE — Progress Notes (Signed)
Post Partum Day 1 Subjective: no complaints, up ad lib, voiding, tolerating PO, and + flatus  Objective: Blood pressure 99/60, pulse 63, temperature 97.9 F (36.6 C), temperature source Oral, resp. rate 18, height 5\' 2"  (1.575 m), weight 74.8 kg, SpO2 99 %, unknown if currently breastfeeding.  Physical Exam:  General: alert and cooperative Lochia: appropriate Uterine Fundus: firm Incision: no concerns DVT Evaluation: No evidence of DVT seen on physical exam.  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 Did counseling with [facility] and also saw a Psychiatrist. Took Buspar, then add Zoloft and several other meds that she tapered off after a year or so and was only taking Vyvance before this pregnancy. Reviewed recurrent risk, warning s/s and can start a medication sooner than later. Didn't do well with Sertraline. But Buspar helped panic attacks. So sending in Rx for Buspar but she'll call 04/08/2019 back when starts and she reports taking some EPDS forms at home and can chart for objective assessment and call if higher than 9. She also had good experience with [facility] and her Psychiatrist and will call them back as needed. Husband reports being more knowledgeable about her symptoms and has great support.    LOS: 1 day   [place] 01/22/2020, 11:50 AM

## 2020-01-24 ENCOUNTER — Other Ambulatory Visit (HOSPITAL_COMMUNITY): Payer: 59

## 2020-01-25 ENCOUNTER — Inpatient Hospital Stay (HOSPITAL_COMMUNITY): Payer: 59

## 2021-10-31 HISTORY — PX: BREAST SURGERY: SHX581

## 2022-09-23 ENCOUNTER — Ambulatory Visit: Payer: 59 | Admitting: Podiatry

## 2022-10-10 ENCOUNTER — Other Ambulatory Visit: Payer: Self-pay | Admitting: Family

## 2022-10-10 DIAGNOSIS — N6312 Unspecified lump in the right breast, upper inner quadrant: Secondary | ICD-10-CM

## 2022-10-21 ENCOUNTER — Ambulatory Visit
Admission: RE | Admit: 2022-10-21 | Discharge: 2022-10-21 | Disposition: A | Discharge status: Home / Self Care | Payer: 59 | Source: Ambulatory Visit | Attending: Family | Admitting: Family

## 2022-10-21 DIAGNOSIS — N6312 Unspecified lump in the right breast, upper inner quadrant: Secondary | ICD-10-CM

## 2022-12-15 ENCOUNTER — Encounter (HOSPITAL_COMMUNITY): Payer: Self-pay

## 2022-12-15 ENCOUNTER — Observation Stay (HOSPITAL_COMMUNITY)
Admission: EM | Admit: 2022-12-15 | Discharge: 2022-12-16 | Disposition: A | Discharge status: Home / Self Care | Payer: 59 | Attending: Surgery | Admitting: Surgery

## 2022-12-15 ENCOUNTER — Emergency Department (HOSPITAL_COMMUNITY): Payer: 59

## 2022-12-15 ENCOUNTER — Other Ambulatory Visit: Payer: Self-pay

## 2022-12-15 DIAGNOSIS — S36112A Contusion of liver, initial encounter: Secondary | ICD-10-CM | POA: Diagnosis not present

## 2022-12-15 DIAGNOSIS — Z79899 Other long term (current) drug therapy: Secondary | ICD-10-CM | POA: Insufficient documentation

## 2022-12-15 DIAGNOSIS — Z87891 Personal history of nicotine dependence: Secondary | ICD-10-CM | POA: Insufficient documentation

## 2022-12-15 DIAGNOSIS — W19XXXA Unspecified fall, initial encounter: Principal | ICD-10-CM

## 2022-12-15 DIAGNOSIS — N83202 Unspecified ovarian cyst, left side: Secondary | ICD-10-CM | POA: Diagnosis not present

## 2022-12-15 DIAGNOSIS — Y9351 Activity, roller skating (inline) and skateboarding: Secondary | ICD-10-CM | POA: Insufficient documentation

## 2022-12-15 DIAGNOSIS — R102 Pelvic and perineal pain: Secondary | ICD-10-CM | POA: Diagnosis present

## 2022-12-15 DIAGNOSIS — J45909 Unspecified asthma, uncomplicated: Secondary | ICD-10-CM | POA: Insufficient documentation

## 2022-12-15 LAB — CBC WITH DIFFERENTIAL/PLATELET
Abs Immature Granulocytes: 0.09 10*3/uL — ABNORMAL HIGH (ref 0.00–0.07)
Basophils Absolute: 0.1 10*3/uL (ref 0.0–0.1)
Basophils Relative: 1 %
Eosinophils Absolute: 0.4 10*3/uL (ref 0.0–0.5)
Eosinophils Relative: 2 %
HCT: 37.6 % (ref 36.0–46.0)
Hemoglobin: 12.5 g/dL (ref 12.0–15.0)
Immature Granulocytes: 1 %
Lymphocytes Relative: 15 %
Lymphs Abs: 2.7 10*3/uL (ref 0.7–4.0)
MCH: 30 pg (ref 26.0–34.0)
MCHC: 33.2 g/dL (ref 30.0–36.0)
MCV: 90.4 fL (ref 80.0–100.0)
Monocytes Absolute: 1.3 10*3/uL — ABNORMAL HIGH (ref 0.1–1.0)
Monocytes Relative: 7 %
Neutro Abs: 13 10*3/uL — ABNORMAL HIGH (ref 1.7–7.7)
Neutrophils Relative %: 74 %
Platelets: 252 10*3/uL (ref 150–400)
RBC: 4.16 MIL/uL (ref 3.87–5.11)
RDW: 13.1 % (ref 11.5–15.5)
WBC: 17.5 10*3/uL — ABNORMAL HIGH (ref 4.0–10.5)
nRBC: 0 % (ref 0.0–0.2)

## 2022-12-15 LAB — I-STAT CHEM 8, ED
BUN: 19 mg/dL (ref 6–20)
Calcium, Ion: 1.23 mmol/L (ref 1.15–1.40)
Chloride: 104 mmol/L (ref 98–111)
Creatinine, Ser: 0.8 mg/dL (ref 0.44–1.00)
Glucose, Bld: 103 mg/dL — ABNORMAL HIGH (ref 70–99)
HCT: 37 % (ref 36.0–46.0)
Hemoglobin: 12.6 g/dL (ref 12.0–15.0)
Potassium: 3.9 mmol/L (ref 3.5–5.1)
Sodium: 139 mmol/L (ref 135–145)
TCO2: 24 mmol/L (ref 22–32)

## 2022-12-15 LAB — COMPREHENSIVE METABOLIC PANEL
ALT: 31 U/L (ref 0–44)
AST: 20 U/L (ref 15–41)
Albumin: 4 g/dL (ref 3.5–5.0)
Alkaline Phosphatase: 64 U/L (ref 38–126)
Anion gap: 3 — ABNORMAL LOW (ref 5–15)
BUN: 22 mg/dL — ABNORMAL HIGH (ref 6–20)
CO2: 24 mmol/L (ref 22–32)
Calcium: 8.6 mg/dL — ABNORMAL LOW (ref 8.9–10.3)
Chloride: 106 mmol/L (ref 98–111)
Creatinine, Ser: 0.69 mg/dL (ref 0.44–1.00)
GFR, Estimated: >60 mL/min (ref >60)
Glucose, Bld: 110 mg/dL — ABNORMAL HIGH (ref 70–99)
Potassium: 3.7 mmol/L (ref 3.5–5.1)
Sodium: 133 mmol/L — ABNORMAL LOW (ref 135–145)
Total Bilirubin: 0.4 mg/dL (ref <1.2)
Total Protein: 6.9 g/dL (ref 6.5–8.1)

## 2022-12-15 LAB — HCG, QUANTITATIVE, PREGNANCY: hCG, Beta Chain, Quant, S: <1 m[IU]/mL (ref <5)

## 2022-12-15 MED ORDER — FENTANYL CITRATE PF 50 MCG/ML IJ SOSY
50.0000 ug | PREFILLED_SYRINGE | Freq: Once | INTRAMUSCULAR | Status: AC
  Administered 2022-12-15: 50 ug via INTRAVENOUS
  Filled 2022-12-15: qty 1

## 2022-12-15 MED ORDER — IOHEXOL 300 MG/ML  SOLN
100.0000 mL | Freq: Once | INTRAMUSCULAR | Status: AC | PRN
  Administered 2022-12-15: 100 mL via INTRAVENOUS

## 2022-12-15 MED ORDER — LACTATED RINGERS IV BOLUS
1000.0000 mL | Freq: Once | INTRAVENOUS | Status: AC
  Administered 2022-12-15: 1000 mL via INTRAVENOUS

## 2022-12-15 NOTE — ED Triage Notes (Signed)
Pt BIBA from skating rink with c/o fall. Bruise right hip and hand from fall. "Pain feels like I am giving birth." Pt takes beta blocker. No shortening or rotation. Inner thigh feels like "stabbing" pain.  18G  LAC fentanyl. Last dose at 1558.  110/70- HR 60

## 2022-12-15 NOTE — H&P (Signed)
Heather Calderon is an 27 y.o. female.   Chief Complaint: Abdominal pain HPI: The patient is a 27 year old white female who fell twice this afternoon while rollerblading.  She landed both times on the right side.  She did not hit her head.  She did not lose consciousness.  After the fall she started complaining of pelvic and abdominal pain.  She came to the ER where a CT scan showed what looks like a very small amount of blood in the abdomen which I think is likely from a small injury to the outer aspect of the liver but it is difficult to see it so small.  She is otherwise in good health.  Past Medical History:  Diagnosis Date   Anxiety    Asthma    CHILDHOOD   Depression     Past Surgical History:  Procedure Laterality Date   ADENOIDECTOMY     BREAST SURGERY Bilateral 10/31/2021   LAPAROSCOPY N/A 11/11/2016   Procedure: LAPAROSCOPY DIAGNOSTIC/IUD Removal;  Surgeon: Olivia Mackie, MD;  Location: [facility];  Service: Gynecology;  Laterality: N/A;   TONSILLECTOMY     WISDOM TOOTH EXTRACTION  2015    Family History  Problem Relation Age of Onset   Hypertension Maternal Grandfather    Diabetes Maternal Grandfather    Heart disease Maternal Grandfather    Diabetes Paternal Grandfather    Social History:  reports that she quit smoking about 9 years ago. Her smoking use included cigarettes. She has never used smokeless tobacco. She reports that she does not drink alcohol and does not use drugs.  Allergies:  Allergies  Allergen Reactions   Coconut Flavor Shortness Of Breath   Hydrocodone Shortness Of Breath   Kiwi Extract Anaphylaxis and Itching   Banana Itching   Cucumber Extract Itching   Hydrocodone-Acetaminophen    Other Itching, Swelling and Other (See Comments)    Tree nuts, melons, bananas   Oxycodone     CHEST TIGHTNESS   Sulfa Antibiotics Hives   Sumatriptan Other (See Comments)    Pt stated the medication makes her muscles seize up and she begins to feel inflamed      (Not in a hospital admission)   Results for orders placed or performed during the hospital encounter of 12/15/22 (from the past 48 hour(s))  hCG, quantitative, pregnancy     Status: None   Collection Time: 12/15/22  4:53 PM  Result Value Ref Range   hCG, Beta Chain, Quant, S <1 <5 mIU/mL    Comment:          GEST. AGE      CONC.  (mIU/mL)   <=1 WEEK        5 - 50     2 WEEKS       50 - 500     3 WEEKS       100 - 10,000     4 WEEKS     1,000 - 30,000     5 WEEKS     3,500 - 115,000   6-8 WEEKS     12,000 - 270,000    12 WEEKS     15,000 - 220,000        FEMALE AND NON-PREGNANT FEMALE:     LESS THAN 5 mIU/mL Performed at [facility], [address] [place], [place] [address]   CBC with Differential     Status: Abnormal   Collection Time: 12/15/22  5:05 PM  Result Value Ref  Range   WBC 17.5 (H) 4.0 - 10.5 K/uL   RBC 4.16 3.87 - 5.11 MIL/uL   Hemoglobin 12.5 12.0 - 15.0 g/dL   HCT 82.9 56.2 - 13.0 %   MCV 90.4 80.0 - 100.0 fL   MCH 30.0 26.0 - 34.0 pg   MCHC 33.2 30.0 - 36.0 g/dL   RDW 86.5 78.4 - 69.6 %   Platelets 252 150 - 400 K/uL   nRBC 0.0 0.0 - 0.2 %   Neutrophils Relative % 74 %   Neutro Abs 13.0 (H) 1.7 - 7.7 K/uL   Lymphocytes Relative 15 %   Lymphs Abs 2.7 0.7 - 4.0 K/uL   Monocytes Relative 7 %   Monocytes Absolute 1.3 (H) 0.1 - 1.0 K/uL   Eosinophils Relative 2 %   Eosinophils Absolute 0.4 0.0 - 0.5 K/uL   Basophils Relative 1 %   Basophils Absolute 0.1 0.0 - 0.1 K/uL   Immature Granulocytes 1 %   Abs Immature Granulocytes 0.09 (H) 0.00 - 0.07 K/uL    Comment: Performed at [facility], [address], [place], [place] [address]  Comprehensive metabolic panel     Status: Abnormal   Collection Time: 12/15/22  5:05 PM  Result Value Ref Range   Sodium 133 (L) 135 - 145 mmol/L   Potassium 3.7 3.5 - 5.1 mmol/L   Chloride 106 98 - 111 mmol/L   CO2 24 22 - 32 mmol/L   Glucose, Bld 110 (H) 70 - 99 mg/dL    Comment:  Glucose reference range applies only to samples taken after fasting for at least 8 hours.   BUN 22 (H) 6 - 20 mg/dL   Creatinine, Ser 4.13 0.44 - 1.00 mg/dL   Calcium 8.6 (L) 8.9 - 10.3 mg/dL   Total Protein 6.9 6.5 - 8.1 g/dL   Albumin 4.0 3.5 - 5.0 g/dL   AST 20 15 - 41 U/L   ALT 31 0 - 44 U/L   Alkaline Phosphatase 64 38 - 126 U/L   Total Bilirubin 0.4 <1.2 mg/dL   GFR, Estimated >24 >40 mL/min    Comment: (NOTE) Calculated using the CKD-EPI Creatinine Equation (2021)    Anion gap 3 (L) 5 - 15    Comment: Performed at [facility], [address] [place], [place] [address]  I-stat chem 8, ED (not at [facility], [facility] or [facility])     Status: Abnormal   Collection Time: 12/15/22  5:18 PM  Result Value Ref Range   Sodium 139 135 - 145 mmol/L   Potassium 3.9 3.5 - 5.1 mmol/L   Chloride 104 98 - 111 mmol/L   BUN 19 6 - 20 mg/dL   Creatinine, Ser 5.36 0.44 - 1.00 mg/dL   Glucose, Bld 644 (H) 70 - 99 mg/dL    Comment: Glucose reference range applies only to samples taken after fasting for at least 8 hours.   Calcium, Ion 1.23 1.15 - 1.40 mmol/L   TCO2 24 22 - 32 mmol/L   Hemoglobin 12.6 12.0 - 15.0 g/dL   HCT 03.4 74.2 - 59.5 %   CT ABDOMEN PELVIS W CONTRAST  Result Date: 12/15/2022 CLINICAL DATA:  Status post fall with subsequent pelvic pain. EXAM: CT ABDOMEN AND PELVIS WITH CONTRAST TECHNIQUE: Multidetector CT imaging of the abdomen and pelvis was performed using the standard protocol following bolus administration of intravenous contrast. RADIATION DOSE REDUCTION: This exam was performed according to the departmental dose-optimization program which includes automated  exposure control, adjustment of the mA and/or kV according to patient size and/or use of iterative reconstruction technique. CONTRAST:  OMNIPAQUE IOHEXOL 300 MG/ML  SOLN COMPARISON:  November 08, 2016 FINDINGS: Lower chest: No acute abnormality. Hepatobiliary: No focal liver abnormality is seen. No  gallstones, gallbladder wall thickening, or biliary dilatation. Pancreas: Unremarkable. No pancreatic ductal dilatation or surrounding inflammatory changes. Spleen: Normal in size without focal abnormality. Adrenals/Urinary Tract: Adrenal glands are unremarkable. Kidneys are normal, without renal calculi, focal lesion, or hydronephrosis. Bladder is unremarkable. Stomach/Bowel: Stomach is within normal limits. Appendix appears normal. A large stool burden is noted. No evidence of bowel wall thickening, distention, or inflammatory changes. Vascular/Lymphatic: A prominent left ovarian vein is seen. No enlarged abdominal or pelvic lymph nodes. Reproductive: Numerous dilated, tortuous vessels are seen along the lateral aspect aspects of a heterogeneous appearing uterus. A 2.0 cm x 1.3 cm x 2.2 cm left adnexal cyst is seen. The right adnexa is unremarkable. Other: No abdominal wall hernia or abnormality. There is a mild amount of nonhemorrhagic posterior perihepatic fluid (approximately 32.36 Hounsfield units). A mild amount of posterior pelvic free fluid is also seen with a suspected hemorrhagic component (approximately 64.73 Hounsfield units). Musculoskeletal: No acute or significant osseous findings. IMPRESSION: 1. Mild amount of posterior perihepatic and posterior pelvic free fluid with a suspected hemorrhagic component. While there is no definite evidence to suggest the presence of an active bleed, correlation with pelvic ultrasound and short-term follow-up pelvic CT is recommended. 2. 2.0 cm x 1.3 cm x 2.2 cm left adnexal cyst. 3. 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 4. Large stool burden without evidence of bowel obstruction. Electronically Signed   By: Aram Candela M.D.   On: 12/15/2022 21:06    Review of Systems  Constitutional: Negative.   HENT: Negative.    Eyes: Negative.   Respiratory: Negative.    Cardiovascular: Negative.   Gastrointestinal:  Positive  for abdominal pain and nausea.  Endocrine: Negative.   Genitourinary: Negative.   Musculoskeletal:  Positive for back pain.  Skin: Negative.   Allergic/Immunologic: Negative.   Neurological: Negative.   Hematological: Negative.   Psychiatric/Behavioral: Negative.      Blood pressure 90/72, pulse 70, temperature 98.6 F (37 C), resp. rate 16, height 5\' 2"  (1.575 m), weight 56.7 kg, SpO2 100%, unknown if currently breastfeeding. Physical Exam Vitals reviewed.  Constitutional:      General: She is not in acute distress.    Appearance: Normal appearance.  HENT:     Head: Normocephalic and atraumatic.     Right Ear: External ear normal.     Left Ear: External ear normal.     Nose: Nose normal.     Mouth/Throat:     Mouth: Mucous membranes are moist.     Pharynx: Oropharynx is clear.  Eyes:     General: No scleral icterus.    Extraocular Movements: Extraocular movements intact.     Conjunctiva/sclera: Conjunctivae normal.     Pupils: Pupils are equal, round, and reactive to light.  Cardiovascular:     Rate and Rhythm: Normal rate and regular rhythm.     Pulses: Normal pulses.     Heart sounds: Normal heart sounds.  Pulmonary:     Effort: Pulmonary effort is normal. No respiratory distress.     Breath sounds: Normal breath sounds.  Abdominal:     General: Abdomen is flat. Bowel sounds are normal.     Palpations: Abdomen is soft.  Comments: There is mild to moderate abdominal tenderness mostly on the right, there is no guarding or peritonitis  Musculoskeletal:        General: No swelling. Normal range of motion.     Cervical back: Normal range of motion and neck supple.  Skin:    General: Skin is warm and dry.     Coloration: Skin is not jaundiced.  Neurological:     General: No focal deficit present.     Mental Status: She is alert and oriented to person, place, and time.  Psychiatric:        Mood and Affect: Mood normal.        Behavior: Behavior normal.       Assessment/Plan The patient suffered a fall while rollerblading and landed on her right side.  She likely has a small liver contusion with a small amount of intra-abdominal blood.  There is no active extravasation.  I suspect that this will resolve on its own but we will plan to observe her with bedrest and serial hemoglobins.  We will transfer her to [facility] to the trauma service  Chevis Pretty III, MD 12/15/2022, 10:35 PM

## 2022-12-15 NOTE — ED Notes (Signed)
ED TO INPATIENT HANDOFF REPORT  ED Nurse Name and Phone #: Jacqulyn Liner EMTP  S Name/Age/Gender Heather Calderon 27 y.o. female Room/Bed: WA07/WA07  Code Status   Code Status: Full Code  Home/SNF/Other Home Patient oriented to: self, place, time, and situation Is this baseline? Yes   Triage Complete: Triage complete  Chief Complaint Liver hematoma and contusion [S36.112A]  Triage Note Pt BIBA from skating rink with c/o fall. Bruise right hip and hand from fall. "Pain feels like I am giving birth." Pt takes beta blocker. No shortening or rotation. Inner thigh feels like "stabbing" pain.  18G  LAC fentanyl. Last dose at 1558.  110/70- HR 60   Allergies Allergies  Allergen Reactions   Coconut Flavor Shortness Of Breath   Hydrocodone Shortness Of Breath   Kiwi Extract Anaphylaxis and Itching   Banana Itching   Cucumber Extract Itching   Hydrocodone-Acetaminophen    Other Itching, Swelling and Other (See Comments)    Tree nuts, melons, bananas   Oxycodone     CHEST TIGHTNESS   Sulfa Antibiotics Hives   Sumatriptan Other (See Comments)    Pt stated the medication makes her muscles seize up and she begins to feel inflamed     Level of Care/Admitting Diagnosis ED Disposition     ED Disposition  Admit   Condition  --   Comment  Hospital Area: [facility] [[id number]]  Level of Care: Med-Surg [16]  May place patient in observation at [facility] or [facility] if equivalent level of care is available:: No  Covid Evaluation: Asymptomatic - no recent exposure (last 10 days) testing not required  Diagnosis: Liver hematoma and contusion [[id number]]  Admitting Physician: TRAUMA MD [[id number]]  Attending Physician: TRAUMA MD [[id number]]          B Medical/Surgery History Past Medical History:  Diagnosis Date   Anxiety    Asthma    CHILDHOOD   Depression    Past Surgical History:  Procedure Laterality Date   ADENOIDECTOMY     BREAST SURGERY  Bilateral 10/31/2021   LAPAROSCOPY N/A 11/11/2016   Procedure: LAPAROSCOPY DIAGNOSTIC/IUD Removal;  Surgeon: Olivia Mackie, MD;  Location: [facility];  Service: Gynecology;  Laterality: N/A;   TONSILLECTOMY     WISDOM TOOTH EXTRACTION  2015     A IV Location/Drains/Wounds Patient Lines/Drains/Airways Status     Active Line/Drains/Airways     Name Placement date Placement time Site Days   Peripheral IV 12/15/22 18 G Left Antecubital 12/15/22  1639  Antecubital  less than 1            Intake/Output Last 24 hours No intake or output data in the 24 hours ending 12/15/22 2311  Labs/Imaging Results for orders placed or performed during the hospital encounter of 12/15/22 (from the past 48 hour(s))  hCG, quantitative, pregnancy     Status: None   Collection Time: 12/15/22  4:53 PM  Result Value Ref Range   hCG, Beta Chain, Quant, S <1 <5 mIU/mL    Comment:          GEST. AGE      CONC.  (mIU/mL)   <=1 WEEK        5 - 50     2 WEEKS       50 - 500     3 WEEKS       100 - 10,000     4 WEEKS     1,000 - 30,000  5 WEEKS     3,500 - 115,000   6-8 WEEKS     12,000 - 270,000    12 WEEKS     15,000 - 220,000        FEMALE AND NON-PREGNANT FEMALE:     LESS THAN 5 mIU/mL Performed at [facility], [address], [place], [place] [address]   CBC with Differential     Status: Abnormal   Collection Time: 12/15/22  5:05 PM  Result Value Ref Range   WBC 17.5 (H) 4.0 - 10.5 K/uL   RBC 4.16 3.87 - 5.11 MIL/uL   Hemoglobin 12.5 12.0 - 15.0 g/dL   HCT 19.1 47.8 - 29.5 %   MCV 90.4 80.0 - 100.0 fL   MCH 30.0 26.0 - 34.0 pg   MCHC 33.2 30.0 - 36.0 g/dL   RDW 62.1 30.8 - 65.7 %   Platelets 252 150 - 400 K/uL   nRBC 0.0 0.0 - 0.2 %   Neutrophils Relative % 74 %   Neutro Abs 13.0 (H) 1.7 - 7.7 K/uL   Lymphocytes Relative 15 %   Lymphs Abs 2.7 0.7 - 4.0 K/uL   Monocytes Relative 7 %   Monocytes Absolute 1.3 (H) 0.1 - 1.0 K/uL   Eosinophils Relative 2 %   Eosinophils  Absolute 0.4 0.0 - 0.5 K/uL   Basophils Relative 1 %   Basophils Absolute 0.1 0.0 - 0.1 K/uL   Immature Granulocytes 1 %   Abs Immature Granulocytes 0.09 (H) 0.00 - 0.07 K/uL    Comment: Performed at [facility], [address], [place], [place] [address]  Comprehensive metabolic panel     Status: Abnormal   Collection Time: 12/15/22  5:05 PM  Result Value Ref Range   Sodium 133 (L) 135 - 145 mmol/L   Potassium 3.7 3.5 - 5.1 mmol/L   Chloride 106 98 - 111 mmol/L   CO2 24 22 - 32 mmol/L   Glucose, Bld 110 (H) 70 - 99 mg/dL    Comment: Glucose reference range applies only to samples taken after fasting for at least 8 hours.   BUN 22 (H) 6 - 20 mg/dL   Creatinine, Ser 2.95 0.44 - 1.00 mg/dL   Calcium 8.6 (L) 8.9 - 10.3 mg/dL   Total Protein 6.9 6.5 - 8.1 g/dL   Albumin 4.0 3.5 - 5.0 g/dL   AST 20 15 - 41 U/L   ALT 31 0 - 44 U/L   Alkaline Phosphatase 64 38 - 126 U/L   Total Bilirubin 0.4 <1.2 mg/dL   GFR, Estimated >28 >41 mL/min    Comment: (NOTE) Calculated using the CKD-EPI Creatinine Equation (2021)    Anion gap 3 (L) 5 - 15    Comment: Performed at [facility], [address] [place], [place] [address]  I-stat chem 8, ED (not at [facility], [facility] or [facility])     Status: Abnormal   Collection Time: 12/15/22  5:18 PM  Result Value Ref Range   Sodium 139 135 - 145 mmol/L   Potassium 3.9 3.5 - 5.1 mmol/L   Chloride 104 98 - 111 mmol/L   BUN 19 6 - 20 mg/dL   Creatinine, Ser 1.02 0.44 - 1.00 mg/dL   Glucose, Bld 725 (H) 70 - 99 mg/dL    Comment: Glucose reference range applies only to samples taken after fasting for at least 8 hours.   Calcium, Ion 1.23 1.15 - 1.40 mmol/L  TCO2 24 22 - 32 mmol/L   Hemoglobin 12.6 12.0 - 15.0 g/dL   HCT 16.1 09.6 - 04.5 %   CT ABDOMEN PELVIS W CONTRAST  Result Date: 12/15/2022 CLINICAL DATA:  Status post fall with subsequent pelvic pain. EXAM: CT ABDOMEN AND PELVIS WITH CONTRAST TECHNIQUE: Multidetector CT  imaging of the abdomen and pelvis was performed using the standard protocol following bolus administration of intravenous contrast. RADIATION DOSE REDUCTION: This exam was performed according to the departmental dose-optimization program which includes automated exposure control, adjustment of the mA and/or kV according to patient size and/or use of iterative reconstruction technique. CONTRAST:  OMNIPAQUE IOHEXOL 300 MG/ML  SOLN COMPARISON:  November 08, 2016 FINDINGS: Lower chest: No acute abnormality. Hepatobiliary: No focal liver abnormality is seen. No gallstones, gallbladder wall thickening, or biliary dilatation. Pancreas: Unremarkable. No pancreatic ductal dilatation or surrounding inflammatory changes. Spleen: Normal in size without focal abnormality. Adrenals/Urinary Tract: Adrenal glands are unremarkable. Kidneys are normal, without renal calculi, focal lesion, or hydronephrosis. Bladder is unremarkable. Stomach/Bowel: Stomach is within normal limits. Appendix appears normal. A large stool burden is noted. No evidence of bowel wall thickening, distention, or inflammatory changes. Vascular/Lymphatic: A prominent left ovarian vein is seen. No enlarged abdominal or pelvic lymph nodes. Reproductive: Numerous dilated, tortuous vessels are seen along the lateral aspect aspects of a heterogeneous appearing uterus. A 2.0 cm x 1.3 cm x 2.2 cm left adnexal cyst is seen. The right adnexa is unremarkable. Other: No abdominal wall hernia or abnormality. There is a mild amount of nonhemorrhagic posterior perihepatic fluid (approximately 32.36 Hounsfield units). A mild amount of posterior pelvic free fluid is also seen with a suspected hemorrhagic component (approximately 64.73 Hounsfield units). Musculoskeletal: No acute or significant osseous findings. IMPRESSION: 1. Mild amount of posterior perihepatic and posterior pelvic free fluid with a suspected hemorrhagic component. While there is no definite evidence to  suggest the presence of an active bleed, correlation with pelvic ultrasound and short-term follow-up pelvic CT is recommended. 2. 2.0 cm x 1.3 cm x 2.2 cm left adnexal cyst. 3. 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 4. Large stool burden without evidence of bowel obstruction. Electronically Signed   By: Aram Candela M.D.   On: 12/15/2022 21:06    Pending Labs [employer] (From admission, onward)     Start     Ordered   Signed and Held  HIV Antibody (routine testing w rflx)  (HIV Antibody (Routine testing w reflex) panel)  Once,   R        Signed and Held   Signed and Held  CBC  Now then every 12 hours,   R      Signed and Held            Vitals/Pain Today's Vitals   12/15/22 1900 12/15/22 2009 12/15/22 2124 12/15/22 2130  BP: (!) 97/58 105/70  90/72  Pulse: 70 72  70  Resp:  18  16  Temp:  98.6 F (37 C)    TempSrc:      SpO2: 98% 100%  100%  Weight:      Height:      PainSc:   8      Isolation Precautions No active isolations  Medications Medications  fentaNYL (SUBLIMAZE) injection 50 mcg (50 mcg Intravenous Given 12/15/22 1712)  lactated ringers bolus 1,000 mL (0 mLs Intravenous Stopped 12/15/22 2015)  iohexol (OMNIPAQUE) 300 MG/ML solution 100 mL (100 mLs Intravenous Contrast Given 12/15/22  1959)  fentaNYL (SUBLIMAZE) injection 50 mcg (50 mcg Intravenous Given 12/15/22 2128)    Mobility walks     Focused Assessments Patient came in from a fall from roller skating. Patient is alert and oriented at this time. Significant other has been in the room with her. Patient is only complaining of lower abdominal pain with tenderness upon palpation. Patient has been compliant and have no concerns with her. Patient has established IV access in the LAC.Patient being admitted to trauma services for evaluation.    R Recommendations: See Admitting Provider Note  Report given to: Alessandra Bevels LPN  Additional Notes:

## 2022-12-15 NOTE — ED Notes (Signed)
Patient states she normally runs on the lower side with her BP when asked from prior readings.

## 2022-12-16 DIAGNOSIS — Z79899 Other long term (current) drug therapy: Secondary | ICD-10-CM | POA: Diagnosis not present

## 2022-12-16 DIAGNOSIS — S36112A Contusion of liver, initial encounter: Secondary | ICD-10-CM | POA: Diagnosis not present

## 2022-12-16 DIAGNOSIS — Z87891 Personal history of nicotine dependence: Secondary | ICD-10-CM | POA: Diagnosis not present

## 2022-12-16 DIAGNOSIS — Y9351 Activity, roller skating (inline) and skateboarding: Secondary | ICD-10-CM | POA: Diagnosis not present

## 2022-12-16 DIAGNOSIS — J45909 Unspecified asthma, uncomplicated: Secondary | ICD-10-CM | POA: Diagnosis not present

## 2022-12-16 DIAGNOSIS — N83202 Unspecified ovarian cyst, left side: Secondary | ICD-10-CM | POA: Diagnosis not present

## 2022-12-16 DIAGNOSIS — R102 Pelvic and perineal pain: Secondary | ICD-10-CM | POA: Diagnosis present

## 2022-12-16 LAB — CBC
HCT: 34.6 % — ABNORMAL LOW (ref 36.0–46.0)
HCT: 35.3 % — ABNORMAL LOW (ref 36.0–46.0)
Hemoglobin: 11.8 g/dL — ABNORMAL LOW (ref 12.0–15.0)
Hemoglobin: 12.1 g/dL (ref 12.0–15.0)
MCH: 30.1 pg (ref 26.0–34.0)
MCH: 30.5 pg (ref 26.0–34.0)
MCHC: 34.1 g/dL (ref 30.0–36.0)
MCHC: 34.3 g/dL (ref 30.0–36.0)
MCV: 88.3 fL (ref 80.0–100.0)
MCV: 88.9 fL (ref 80.0–100.0)
Platelets: 207 10*3/uL (ref 150–400)
Platelets: 228 10*3/uL (ref 150–400)
RBC: 3.92 MIL/uL (ref 3.87–5.11)
RBC: 3.97 MIL/uL (ref 3.87–5.11)
RDW: 13 % (ref 11.5–15.5)
RDW: 13.2 % (ref 11.5–15.5)
WBC: 16.3 10*3/uL — ABNORMAL HIGH (ref 4.0–10.5)
WBC: 8.9 10*3/uL (ref 4.0–10.5)
nRBC: 0 % (ref 0.0–0.2)
nRBC: 0 % (ref 0.0–0.2)

## 2022-12-16 LAB — HIV ANTIBODY (ROUTINE TESTING W REFLEX): HIV Screen 4th Generation wRfx: NONREACTIVE

## 2022-12-16 MED ORDER — HYDROXYZINE HCL 25 MG PO TABS: 25.0000 mg | ORAL_TABLET | Freq: Every day | ORAL | Status: DC | PRN

## 2022-12-16 MED ORDER — FENTANYL CITRATE PF 50 MCG/ML IJ SOSY
50.0000 ug | PREFILLED_SYRINGE | Freq: Once | INTRAMUSCULAR | Status: AC
  Administered 2022-12-16: 50 ug via INTRAVENOUS
  Filled 2022-12-16: qty 1

## 2022-12-16 MED ORDER — MORPHINE SULFATE (PF) 2 MG/ML IV SOLN: 1.0000 mg | INTRAVENOUS | Status: DC | PRN

## 2022-12-16 MED ORDER — TRAMADOL HCL 50 MG PO TABS: 50.0000 mg | ORAL_TABLET | Freq: Four times a day (QID) | ORAL | 0 refills | Status: AC | PRN

## 2022-12-16 MED ORDER — ONDANSETRON HCL 4 MG/2ML IJ SOLN: 4.0000 mg | Freq: Four times a day (QID) | INTRAMUSCULAR | Status: DC | PRN

## 2022-12-16 MED ORDER — TRAMADOL HCL 50 MG PO TABS
50.0000 mg | ORAL_TABLET | Freq: Four times a day (QID) | ORAL | Status: DC
  Administered 2022-12-16 (×2): 50 mg via ORAL
  Filled 2022-12-16 (×3): qty 1

## 2022-12-16 MED ORDER — PANTOPRAZOLE SODIUM 40 MG IV SOLR
40.0000 mg | Freq: Every day | INTRAVENOUS | Status: DC
Start: 2022-12-16 — End: 2022-12-16
  Administered 2022-12-16: 40 mg via INTRAVENOUS
  Filled 2022-12-16: qty 10

## 2022-12-16 MED ORDER — MIRTAZAPINE 15 MG PO TABS
15.0000 mg | ORAL_TABLET | Freq: Every day | ORAL | Status: DC
  Administered 2022-12-16: 15 mg via ORAL
  Filled 2022-12-16: qty 2

## 2022-12-16 MED ORDER — HYDROXYZINE PAMOATE 25 MG PO CAPS: 25.0000 mg | ORAL_CAPSULE | Freq: Every day | ORAL | Status: DC | PRN

## 2022-12-16 MED ORDER — KCL IN DEXTROSE-NACL 20-5-0.9 MEQ/L-%-% IV SOLN
INTRAVENOUS | Status: DC
  Filled 2022-12-16: qty 1000

## 2022-12-16 MED ORDER — VILOXAZINE HCL ER 200 MG PO CP24
400.0000 mg | ORAL_CAPSULE | Freq: Every day | ORAL | Status: DC
  Filled 2022-12-16 (×2): qty 2

## 2022-12-16 MED ORDER — LURASIDONE HCL 40 MG PO TABS
40.0000 mg | ORAL_TABLET | Freq: Every day | ORAL | Status: DC
  Filled 2022-12-16 (×2): qty 1

## 2022-12-16 MED ORDER — ONDANSETRON 4 MG PO TBDP: 4.0000 mg | ORAL_TABLET | Freq: Four times a day (QID) | ORAL | Status: DC | PRN

## 2022-12-16 NOTE — Discharge Instructions (Signed)
Call your gynecologist as soon as possible after discharge for follow up appointment of hemorrhagic cyst

## 2022-12-16 NOTE — ED Provider Notes (Signed)
[place] EMERGENCY DEPARTMENT AT [facility] Provider Note   CSN: [id number] Arrival date & time: 12/15/22  1608     History {Add pertinent medical, surgical, social history, OB history to HPI:1} Chief Complaint  Patient presents with   Fall   Hip Pain    Heather Calderon is a 27 y.o. female.  HPI       [facility] Home Medications Prior to Admission medications   Medication Sig Start Date End Date Taking? Authorizing Provider  Cholecalciferol (VITAMIN D-3 PO) Take 600 Units by mouth daily.   Yes [provider]  hydrOXYzine (VISTARIL) 25 MG capsule Take 25 mg by mouth daily as needed for anxiety. 11/23/22  Yes [provider]  lurasidone (LATUDA) 40 MG TABS tablet Take 40 mg by mouth at bedtime. 11/26/22  Yes [provider]  mirtazapine (REMERON) 15 MG tablet Take 15 mg by mouth at bedtime. 11/28/22  Yes [provider]  polyethylene glycol (MIRALAX / GLYCOLAX) 17 g packet Take 17 g by mouth daily as needed for mild constipation or moderate constipation.   Yes [provider]  propranolol (INDERAL) 10 MG tablet Take 10 mg by mouth 2 (two) times daily. 12/04/22  Yes [provider]  QELBREE 200 MG 24 hr capsule Take 400 mg by mouth at bedtime. 11/19/22  Yes [provider]      Allergies    Coconut flavor, Hydrocodone, Kiwi extract, Banana, Cucumber extract, Hydrocodone-acetaminophen, Other, Oxycodone, Sulfa antibiotics, and Sumatriptan    Review of Systems   Review of Systems  Physical Exam Updated Vital Signs BP 108/81   Pulse 70   Temp 98.6 F (37 C)   Resp 16   Ht 5\' 2"  (1.575 m)   Wt 56.7 kg   SpO2 100%   BMI 22.86 kg/m  Physical Exam  ED Results / Procedures / Treatments   Labs (all labs ordered are listed, but only abnormal results are displayed) Labs Reviewed  CBC WITH DIFFERENTIAL/PLATELET - Abnormal; Notable for the following components:      Result Value    WBC 17.5 (*)    Neutro Abs 13.0 (*)    Monocytes Absolute 1.3 (*)    Abs Immature Granulocytes 0.09 (*)    All other components within normal limits  COMPREHENSIVE METABOLIC PANEL - Abnormal; Notable for the following components:   Sodium 133 (*)    Glucose, Bld 110 (*)    BUN 22 (*)    Calcium 8.6 (*)    Anion gap 3 (*)    All other components within normal limits  I-STAT CHEM 8, ED - Abnormal; Notable for the following components:   Glucose, Bld 103 (*)    All other components within normal limits  HCG, QUANTITATIVE, PREGNANCY    EKG None  Radiology US PELVIC COMPLETE W TRANSVAGINAL AND TORSION R/O  Result Date: 12/15/2022 CLINICAL DATA:  Acute abdominal pain. EXAM: TRANSABDOMINAL AND TRANSVAGINAL ULTRASOUND OF PELVIS DOPPLER ULTRASOUND OF OVARIES TECHNIQUE: Both transabdominal and transvaginal ultrasound examinations of the pelvis were performed. Transabdominal technique was performed for global imaging of the pelvis including uterus, ovaries, adnexal regions, and pelvic cul-de-sac. It was necessary to proceed with endovaginal exam following the transabdominal exam to visualize the anatomy. Color and duplex Doppler ultrasound was utilized to evaluate blood flow to the ovaries. COMPARISON:  12/15/2022. FINDINGS: Uterus Measurements: 9.4 x 4.5 x 5.9 cm = volume: 129.5 mL. No fibroids or other mass visualized. Endometrium Thickness: 7.7 mm.  No focal abnormality visualized. Right ovary Measurements: 2.5 x 1.9 x 2.1 cm = volume: 5.1 mL. Normal appearance/no adnexal mass. Left ovary Measurements: 3.4 x 3.4 x 3.2 cm = volume: 19.0 mL. Complex cyst with fine septations is noted measuring 2.5 x 2.0 x 2.1 cm, likely hemorrhagic cyst. Pulsed Doppler evaluation of both ovaries demonstrates normal low-resistance arterial and venous waveforms. Other findings A moderate amount of complex free fluid is present in the pelvis. A masslike heterogeneous structure is noted in the cul-de-sac measuring 9.0  x 3.5 x 7.4 cm. No significant vascularity is seen. IMPRESSION: 1. No evidence of ovarian torsion. 2. Complex structure in the cul-de-sac measuring 9.0 x 3.5 x 7.4 cm with a moderate amount of complex free fluid in the pelvis, suggesting hemoperitoneum with blood clot, possibly related to ruptured cyst. Follow-up ultrasound is recommended to document resolution and exclude the possibility of underlying mass. 3. Complex cyst in the left ovary, likely hemorrhagic cyst. Electronically Signed   By: Thornell Sartorius M.D.   On: 12/15/2022 23:31   CT ABDOMEN PELVIS W CONTRAST  Result Date: 12/15/2022 CLINICAL DATA:  Status post fall with subsequent pelvic pain. EXAM: CT ABDOMEN AND PELVIS WITH CONTRAST TECHNIQUE: Multidetector CT imaging of the abdomen and pelvis was performed using the standard protocol following bolus administration of intravenous contrast. RADIATION DOSE REDUCTION: This exam was performed according to the departmental dose-optimization program which includes automated exposure control, adjustment of the mA and/or kV according to patient size and/or use of iterative reconstruction technique. CONTRAST:  OMNIPAQUE IOHEXOL 300 MG/ML  SOLN COMPARISON:  November 08, 2016 FINDINGS: Lower chest: No acute abnormality. Hepatobiliary: No focal liver abnormality is seen. No gallstones, gallbladder wall thickening, or biliary dilatation. Pancreas: Unremarkable. No pancreatic ductal dilatation or surrounding inflammatory changes. Spleen: Normal in size without focal abnormality. Adrenals/Urinary Tract: Adrenal glands are unremarkable. Kidneys are normal, without renal calculi, focal lesion, or hydronephrosis. Bladder is unremarkable. Stomach/Bowel: Stomach is within normal limits. Appendix appears normal. A large stool burden is noted. No evidence of bowel wall thickening, distention, or inflammatory changes. Vascular/Lymphatic: A prominent left ovarian vein is seen. No enlarged abdominal or pelvic lymph nodes.  Reproductive: Numerous dilated, tortuous vessels are seen along the lateral aspect aspects of a heterogeneous appearing uterus. A 2.0 cm x 1.3 cm x 2.2 cm left adnexal cyst is seen. The right adnexa is unremarkable. Other: No abdominal wall hernia or abnormality. There is a mild amount of nonhemorrhagic posterior perihepatic fluid (approximately 32.36 Hounsfield units). A mild amount of posterior pelvic free fluid is also seen with a suspected hemorrhagic component (approximately 64.73 Hounsfield units). Musculoskeletal: No acute or significant osseous findings. IMPRESSION: 1. Mild amount of posterior perihepatic and posterior pelvic free fluid with a suspected hemorrhagic component. While there is no definite evidence to suggest the presence of an active bleed, correlation with pelvic ultrasound and short-term follow-up pelvic CT is recommended. 2. 2.0 cm x 1.3 cm x 2.2 cm left adnexal cyst. 3. 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 4. Large stool burden without evidence of bowel obstruction. Electronically Signed   By: Aram Candela M.D.   On: 12/15/2022 21:06    Procedures Procedures  {Document cardiac monitor, telemetry assessment procedure when appropriate:1}  Medications Ordered in ED Medications  fentaNYL (SUBLIMAZE) injection 50 mcg (50 mcg Intravenous Given 12/15/22 1712)  lactated ringers bolus 1,000 mL (0 mLs Intravenous Stopped 12/15/22 2015)  iohexol (OMNIPAQUE) 300 MG/ML solution 100 mL (100 mLs Intravenous  Contrast Given 12/15/22 1959)  fentaNYL (SUBLIMAZE) injection 50 mcg (50 mcg Intravenous Given 12/15/22 2128)    ED Course/ Medical Decision Making/ A&P   {   Click here for ABCD2, HEART and other calculatorsREFRESH Note before signing :1}                              Medical Decision Making Amount and/or Complexity of Data Reviewed Labs: ordered. Radiology: ordered.  Risk Prescription drug management. Decision regarding  hospitalization.   ***  {Document critical care time when appropriate:1} {Document review of labs and clinical decision tools ie heart score, Chads2Vasc2 etc:1}  {Document your independent review of radiology images, and any outside records:1} {Document your discussion with family members, caretakers, and with consultants:1} {Document social determinants of health affecting pt's care:1} {Document your decision making why or why not admission, treatments were needed:1} Final Clinical Impression(s) / ED Diagnoses Final diagnoses:  None    Rx / DC Orders ED Discharge Orders     None

## 2022-12-16 NOTE — ED Notes (Signed)
Patient takes her own Vaughan Basta due to pharmacy not carrying it. Pharmacy verified with this Clinical research associate and made a note on her end.

## 2022-12-16 NOTE — ED Notes (Signed)
[facility] has been called.  

## 2022-12-16 NOTE — Progress Notes (Signed)
Progress Note     Subjective: Have pain all along lower abdomen though worse on the right side that she fell on. This includes some suprapubic and pelvic pain that worsens with urinating and BM. No hematuria or dysuria. Pain well controlled with tramadol. She has mobilized independently to the bathroom without dizziness or SHOB. She is drinking clear liquids without n/v or worse abdominal pain and has appetite for solid food. She has known history of ovarian cysts but never had complications for ruptured cyst before. She has history of IUD migration that necessitated retrieval from right pelvis. She has a gyn and has seen them within the last 6 months for work up of menorrhagia and dysmenorrhea that has been going on since birth of her daughter in 2021  BP is 90s/50s this am but she states this is not abnormal for her  Mother is at bedside  Objective: Vital signs in last 24 hours: Temp:  [97.6 F (36.4 C)-98.6 F (37 C)] 98.5 F (36.9 C) (11/11 0725) Pulse Rate:  [54-78] 78 (11/11 0725) Resp:  [16-18] 16 (11/11 0725) BP: (90-124)/(58-98) 92/58 (11/11 0725) SpO2:  [98 %-100 %] 99 % (11/11 0725) Weight:  [56.7 kg] 56.7 kg (11/10 1717) Last BM Date : 12/14/22  Intake/Output from previous day: 11/10 0701 - 11/11 0700 In: 408.9 [I.V.:408.9] Out: -  Intake/Output this shift: No intake/output data recorded.  PE: General: pleasant, WD, female who is laying in bed in NAD Lungs: ed.  Respiratory effort nonlabored Abd: soft, TTP across lower abdomen but greatest suprapubic and RLQ and R flank. Mild distension. No ecchymosis or deformity. MSK: all 4 extremities are symmetrical with no cyanosis, clubbing, or edema. Skin: warm and dry  Psych: A&Ox3 with an appropriate affect.    Lab Results:  Recent Labs    12/15/22 1705 12/15/22 1718 12/16/22 0026  WBC 17.5*  --  16.3*  HGB 12.5 12.6 12.1  HCT 37.6 37.0 35.3*  PLT 252  --  228   BMET Recent Labs    12/15/22 1705  12/15/22 1718  NA 133* 139  K 3.7 3.9  CL 106 104  CO2 24  --   GLUCOSE 110* 103*  BUN 22* 19  CREATININE 0.69 0.80  CALCIUM 8.6*  --    PT/INR No results for input(s): "LABPROT", "INR" in the last 72 hours. CMP     Component Value Date/Time   NA 139 12/15/2022 1718   K 3.9 12/15/2022 1718   CL 104 12/15/2022 1718   CO2 24 12/15/2022 1705   GLUCOSE 103 (H) 12/15/2022 1718   BUN 19 12/15/2022 1718   CREATININE 0.80 12/15/2022 1718   CALCIUM 8.6 (L) 12/15/2022 1705   PROT 6.9 12/15/2022 1705   ALBUMIN 4.0 12/15/2022 1705   AST 20 12/15/2022 1705   ALT 31 12/15/2022 1705   ALKPHOS 64 12/15/2022 1705   BILITOT 0.4 12/15/2022 1705   GFRNONAA >60 12/15/2022 1705   GFRAA >60 02/08/2019 0919   Lipase     Component Value Date/Time   LIPASE 19 02/08/2019 0919       Studies/Results: US PELVIC COMPLETE W TRANSVAGINAL AND TORSION R/O  Result Date: 12/15/2022 CLINICAL DATA:  Acute abdominal pain. EXAM: TRANSABDOMINAL AND TRANSVAGINAL ULTRASOUND OF PELVIS DOPPLER ULTRASOUND OF OVARIES TECHNIQUE: Both transabdominal and transvaginal ultrasound examinations of the pelvis were performed. Transabdominal technique was performed for global imaging of the pelvis including uterus, ovaries, adnexal regions, and pelvic cul-de-sac. It was necessary to proceed with endovaginal  exam following the transabdominal exam to visualize the anatomy. Color and duplex Doppler ultrasound was utilized to evaluate blood flow to the ovaries. COMPARISON:  12/15/2022. FINDINGS: Uterus Measurements: 9.4 x 4.5 x 5.9 cm = volume: 129.5 mL. No fibroids or other mass visualized. Endometrium Thickness: 7.7 mm.  No focal abnormality visualized. Right ovary Measurements: 2.5 x 1.9 x 2.1 cm = volume: 5.1 mL. Normal appearance/no adnexal mass. Left ovary Measurements: 3.4 x 3.4 x 3.2 cm = volume: 19.0 mL. Complex cyst with fine septations is noted measuring 2.5 x 2.0 x 2.1 cm, likely hemorrhagic cyst. Pulsed Doppler  evaluation of both ovaries demonstrates normal low-resistance arterial and venous waveforms. Other findings A moderate amount of complex free fluid is present in the pelvis. A masslike heterogeneous structure is noted in the cul-de-sac measuring 9.0 x 3.5 x 7.4 cm. No significant vascularity is seen. IMPRESSION: 1. No evidence of ovarian torsion. 2. Complex structure in the cul-de-sac measuring 9.0 x 3.5 x 7.4 cm with a moderate amount of complex free fluid in the pelvis, suggesting hemoperitoneum with blood clot, possibly related to ruptured cyst. Follow-up ultrasound is recommended to document resolution and exclude the possibility of underlying mass. 3. Complex cyst in the left ovary, likely hemorrhagic cyst. Electronically Signed   By: Thornell Sartorius M.D.   On: 12/15/2022 23:31   CT ABDOMEN PELVIS W CONTRAST  Result Date: 12/15/2022 CLINICAL DATA:  Status post fall with subsequent pelvic pain. EXAM: CT ABDOMEN AND PELVIS WITH CONTRAST TECHNIQUE: Multidetector CT imaging of the abdomen and pelvis was performed using the standard protocol following bolus administration of intravenous contrast. RADIATION DOSE REDUCTION: This exam was performed according to the departmental dose-optimization program which includes automated exposure control, adjustment of the mA and/or kV according to patient size and/or use of iterative reconstruction technique. CONTRAST:  OMNIPAQUE IOHEXOL 300 MG/ML  SOLN COMPARISON:  November 08, 2016 FINDINGS: Lower chest: No acute abnormality. Hepatobiliary: No focal liver abnormality is seen. No gallstones, gallbladder wall thickening, or biliary dilatation. Pancreas: Unremarkable. No pancreatic ductal dilatation or surrounding inflammatory changes. Spleen: Normal in size without focal abnormality. Adrenals/Urinary Tract: Adrenal glands are unremarkable. Kidneys are normal, without renal calculi, focal lesion, or hydronephrosis. Bladder is unremarkable. Stomach/Bowel: Stomach is within  normal limits. Appendix appears normal. A large stool burden is noted. No evidence of bowel wall thickening, distention, or inflammatory changes. Vascular/Lymphatic: A prominent left ovarian vein is seen. No enlarged abdominal or pelvic lymph nodes. Reproductive: Numerous dilated, tortuous vessels are seen along the lateral aspect aspects of a heterogeneous appearing uterus. A 2.0 cm x 1.3 cm x 2.2 cm left adnexal cyst is seen. The right adnexa is unremarkable. Other: No abdominal wall hernia or abnormality. There is a mild amount of nonhemorrhagic posterior perihepatic fluid (approximately 32.36 Hounsfield units). A mild amount of posterior pelvic free fluid is also seen with a suspected hemorrhagic component (approximately 64.73 Hounsfield units). Musculoskeletal: No acute or significant osseous findings. IMPRESSION: 1. Mild amount of posterior perihepatic and posterior pelvic free fluid with a suspected hemorrhagic component. While there is no definite evidence to suggest the presence of an active bleed, correlation with pelvic ultrasound and short-term follow-up pelvic CT is recommended. 2. 2.0 cm x 1.3 cm x 2.2 cm left adnexal cyst. 3. 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 4. Large stool burden without evidence of bowel obstruction. Electronically Signed   By: Aram Candela M.D.   On: 12/15/2022 21:06    Anti-infectives: Anti-infectives (  From admission, onward)    None        Assessment/Plan Mechanical Fall while rollerblading  Intraabdominal free fluid, possible liver contusion, ?ruptured hemorrhagic ovarian cyst, L likely hemorrhagic ovarian cyst  - hgb has remained stable. WBC elevated but afebrile. Some hypotension but she states this is close to her baseline. - tolerating clears. Advance diet - off bedrest this am. Mobilize and recheck Hgb pm - next CBC around 1200  - will discuss with MD necessity of inpatient gynecology consult vs follow up  with established gyn. She does need repeat pelvic US  at some point to follow up resolution of complex free fluid/possible ruptured cyst    FEN: CLD ID: none VTE: none  I reviewed last 24 h vitals and pain scores, last 48 h intake and output, last 24 h labs and trends, and last 24 h imaging results.   LOS: 0 days   Eric Form, [facility] 12/16/2022, 8:22 AM Please see Amion for pager number during day hours 7:00am-4:30pm

## 2022-12-16 NOTE — Progress Notes (Addendum)
Pt verbalized understanding of dc intructions. All belongings given to patient. Volunteer services to wheel patient out to the main entrance.

## 2022-12-16 NOTE — TOC CAGE-AID Note (Signed)
Transition of Care [facility]) - CAGE-AID Screening   Patient Details  Name: Heather Calderon MRN: [id number] Date of Birth: 1995/05/09   Hewitt Shorts, RN Trauma Response Nurse Phone Number: [phone] 12/16/2022, 10:12 AM    CAGE-AID Screening:    Have You Ever Felt You Ought to Cut Down on Your Drinking or Drug Use?: No Have People Annoyed You By Critizing Your Drinking Or Drug Use?: No Have You Felt Bad Or Guilty About Your Drinking Or Drug Use?: No Have You Ever Had a Drink or Used Drugs First Thing In The Morning to Steady Your Nerves or to Get Rid of a Hangover?: No CAGE-AID Score: 0  Substance Abuse Education Offered: No (reports no alcohol or drug use.  No services needed at this time)

## 2022-12-16 NOTE — Plan of Care (Signed)

## 2022-12-18 ENCOUNTER — Emergency Department (HOSPITAL_COMMUNITY)
Admission: EM | Admit: 2022-12-18 | Discharge: 2022-12-18 | Disposition: A | Discharge status: Home / Self Care | Payer: 59 | Attending: Emergency Medicine | Admitting: Emergency Medicine

## 2022-12-18 ENCOUNTER — Emergency Department (HOSPITAL_COMMUNITY): Payer: 59

## 2022-12-18 ENCOUNTER — Encounter (HOSPITAL_COMMUNITY): Payer: Self-pay | Admitting: Emergency Medicine

## 2022-12-18 ENCOUNTER — Other Ambulatory Visit: Payer: Self-pay

## 2022-12-18 DIAGNOSIS — R1030 Lower abdominal pain, unspecified: Secondary | ICD-10-CM

## 2022-12-18 DIAGNOSIS — K5900 Constipation, unspecified: Secondary | ICD-10-CM

## 2022-12-18 LAB — COMPREHENSIVE METABOLIC PANEL
ALT: 22 U/L (ref 0–44)
AST: 17 U/L (ref 15–41)
Albumin: 3.9 g/dL (ref 3.5–5.0)
Alkaline Phosphatase: 63 U/L (ref 38–126)
Anion gap: 9 (ref 5–15)
BUN: 11 mg/dL (ref 6–20)
CO2: 26 mmol/L (ref 22–32)
Calcium: 9.5 mg/dL (ref 8.9–10.3)
Chloride: 102 mmol/L (ref 98–111)
Creatinine, Ser: 0.83 mg/dL (ref 0.44–1.00)
GFR, Estimated: >60 mL/min (ref >60)
Glucose, Bld: 87 mg/dL (ref 70–99)
Potassium: 3.8 mmol/L (ref 3.5–5.1)
Sodium: 137 mmol/L (ref 135–145)
Total Bilirubin: 0.5 mg/dL (ref <1.2)
Total Protein: 6.3 g/dL — ABNORMAL LOW (ref 6.5–8.1)

## 2022-12-18 LAB — CBC
HCT: 38.5 % (ref 36.0–46.0)
Hemoglobin: 13.1 g/dL (ref 12.0–15.0)
MCH: 30 pg (ref 26.0–34.0)
MCHC: 34 g/dL (ref 30.0–36.0)
MCV: 88.3 fL (ref 80.0–100.0)
Platelets: 246 10*3/uL (ref 150–400)
RBC: 4.36 MIL/uL (ref 3.87–5.11)
RDW: 12.7 % (ref 11.5–15.5)
WBC: 10.7 10*3/uL — ABNORMAL HIGH (ref 4.0–10.5)
nRBC: 0 % (ref 0.0–0.2)

## 2022-12-18 MED ORDER — IOHEXOL 300 MG/ML  SOLN
75.0000 mL | Freq: Once | INTRAMUSCULAR | Status: AC | PRN
  Administered 2022-12-18: 75 mL via INTRAVENOUS

## 2022-12-18 MED ORDER — KETOROLAC TROMETHAMINE 15 MG/ML IJ SOLN
15.0000 mg | Freq: Once | INTRAMUSCULAR | Status: AC
  Administered 2022-12-18: 15 mg via INTRAVENOUS
  Filled 2022-12-18: qty 1

## 2022-12-18 MED ORDER — MORPHINE SULFATE (PF) 4 MG/ML IV SOLN
4.0000 mg | Freq: Once | INTRAVENOUS | Status: AC
  Administered 2022-12-18: 4 mg via INTRAVENOUS
  Filled 2022-12-18: qty 1

## 2022-12-18 NOTE — ED Triage Notes (Signed)
Pt was just discharged from hospital 2 days ago after having a small some free fluid in her abd after a roller blade accident , pt states hat the pain is back and her abd is swollen , abd is firm and painful to palpation

## 2022-12-18 NOTE — ED Notes (Signed)
Went to CT

## 2022-12-18 NOTE — ED Provider Notes (Signed)
[place] EMERGENCY DEPARTMENT AT [facility] Provider Note   CSN: [id number] Arrival date & time: 12/18/22  4098     History  Chief Complaint  Patient presents with   Abdominal Pain    Heather Calderon is a 27 y.o. female.  HPI 27 year old female presents with worsening abdominal pain and swelling.  3 days ago she fell while rollerskating and injured her lower abdomen/pelvis.  Went to [facility] where they found hemoperitoneum and trauma surgery admitted overnight.  Was doing well with pain control.  They said it might also have been coming from a ruptured ovarian cyst.  Was doing okay with the tramadol yesterday but then today her abdomen is more swollen and more painful.  No vomiting.  Home Medications Prior to Admission medications   Medication Sig Start Date End Date Taking? Authorizing Provider  Cholecalciferol (VITAMIN D-3 PO) Take 600 Units by mouth daily.   Yes [provider]  hydrOXYzine (VISTARIL) 25 MG capsule Take 25 mg by mouth daily as needed for anxiety. 11/23/22  Yes [provider]  lurasidone (LATUDA) 40 MG TABS tablet Take 40 mg by mouth at bedtime. 11/26/22  Yes [provider]  mirtazapine (REMERON) 15 MG tablet Take 15 mg by mouth at bedtime. 11/28/22  Yes [provider]  polyethylene glycol (MIRALAX / GLYCOLAX) 17 g packet Take 17 g by mouth daily as needed for mild constipation or moderate constipation.   Yes [provider]  propranolol (INDERAL) 10 MG tablet Take 10 mg by mouth 2 (two) times daily. 12/04/22  Yes [provider]  QELBREE 200 MG 24 hr capsule Take 400 mg by mouth at bedtime. 11/19/22  Yes [provider]  traMADol (ULTRAM) 50 MG tablet Take 1 tablet (50 mg total) by mouth every 6 (six) hours as needed for up to 5 days. 12/16/22 12/21/22 Yes Eric Form, PA-C      Allergies    Coconut flavor, Hydrocodone, Kiwi extract, Banana, Cucumber extract,  Hydrocodone-acetaminophen, Other, Oxycodone, Sulfa antibiotics, and Sumatriptan    Review of Systems   Review of Systems  Gastrointestinal:  Positive for abdominal distention and abdominal pain.    Physical Exam Updated Vital Signs BP 118/82 (BP Location: Right Arm)   Pulse 82   Temp 98.6 F (37 C) (Oral)   Resp 18   LMP 11/28/2022 (Within Weeks)   SpO2 100%  Physical Exam Vitals and nursing note reviewed.  Constitutional:      Appearance: She is well-developed.  HENT:     Head: Normocephalic and atraumatic.  Cardiovascular:     Rate and Rhythm: Normal rate and regular rhythm.     Heart sounds: Normal heart sounds.  Pulmonary:     Effort: Pulmonary effort is normal.     Breath sounds: Normal breath sounds.  Abdominal:     General: There is distension (mild, lower).     Palpations: Abdomen is soft.     Tenderness: There is abdominal tenderness (worst in lower abdomen).  Skin:    General: Skin is warm and dry.  Neurological:     Mental Status: She is alert.     ED Results / Procedures / Treatments   Labs (all labs ordered are listed, but only abnormal results are displayed) Labs Reviewed  CBC - Abnormal; Notable for the following components:      Result Value   WBC 10.7 (*)    All other components within normal limits  COMPREHENSIVE METABOLIC PANEL -  Abnormal; Notable for the following components:   Total Protein 6.3 (*)    All other components within normal limits    EKG None  Radiology CT ABDOMEN PELVIS W CONTRAST  Result Date: 12/18/2022 CLINICAL DATA:  Status post trauma. EXAM: CT ABDOMEN AND PELVIS WITH CONTRAST TECHNIQUE: Multidetector CT imaging of the abdomen and pelvis was performed using the standard protocol following bolus administration of intravenous contrast. RADIATION DOSE REDUCTION: This exam was performed according to the departmental dose-optimization program which includes automated exposure control, adjustment of the mA and/or kV according  to patient size and/or use of iterative reconstruction technique. CONTRAST:  75mL OMNIPAQUE IOHEXOL 300 MG/ML  SOLN COMPARISON:  December 15, 2022 FINDINGS: Lower chest: No acute abnormality. Hepatobiliary: No focal liver abnormality is seen. No gallstones, gallbladder wall thickening, or biliary dilatation. Pancreas: Unremarkable. No pancreatic ductal dilatation or surrounding inflammatory changes. Spleen: Normal in size without focal abnormality. Adrenals/Urinary Tract: Adrenal glands are unremarkable. Kidneys are normal, without renal calculi, focal lesion, or hydronephrosis. Bladder is unremarkable. Stomach/Bowel: Stomach is within normal limits. Appendix appears normal. A large stool burden is noted. No evidence of bowel wall thickening, distention, or inflammatory changes. Vascular/Lymphatic: No significant vascular findings are present. No enlarged abdominal or pelvic lymph nodes. Reproductive: The uterus and right adnexa are unremarkable. A 1.9 cm x 1.4 cm x 2.2 cm left adnexal cyst is seen. Other: No abdominal wall hernia or abnormality. The posterior perihepatic free fluid seen on the prior study is no longer present. There is a small amount of posterior pelvic free fluid is again seen. This is stable in appearance when compared to the prior study. Musculoskeletal: No acute or significant osseous findings. IMPRESSION: 1. 1.9 cm x 1.4 cm x 2.2 cm left adnexal cyst. No follow-up imaging is recommended. This recommendation follows ACR consensus guidelines: White Paper of the ACR Incidental Findings Committee II on Adnexal Findings. J Am Coll Radiol 2013:10:675-681. 2. A small, stable posterior pelvic free fluid. Electronically Signed   By: Aram Candela M.D.   On: 12/18/2022 22:32    Procedures Procedures    Medications Ordered in ED Medications  morphine (PF) 4 MG/ML injection 4 mg (4 mg Intravenous Given 12/18/22 2051)  ketorolac (TORADOL) 15 MG/ML injection 15 mg (15 mg Intravenous Given  12/18/22 2045)  iohexol (OMNIPAQUE) 300 MG/ML solution 75 mL (75 mLs Intravenous Contrast Given 12/18/22 2150)    ED Course/ Medical Decision Making/ A&P                                 Medical Decision Making Amount and/or Complexity of Data Reviewed Labs: ordered.    Details: Slight leukocytosis. Improved hemoglobin. Radiology: ordered and independent interpretation performed.    Details: Decreased fluid in pelvis  Risk Prescription drug management.   Patient is well-appearing and hemodynamically stable.  She was given morphine and Toradol for pain.  CT was obtained which shows improvement in her free fluid.  There is sizable constipation which is probably contributing as well.  Talking to patient more, she has a longstanding history of constipation which is probably worse with the recent trauma and tramadol use.  Labs are reassuring and improved.  Discussed supportive care for constipation including magnesium citrate as she has not yet tried this. She is otherwise is well appearing and stable for discharge.        Final Clinical Impression(s) / ED Diagnoses Final diagnoses:  Lower  abdominal pain  Constipation, unspecified constipation type    Rx / DC Orders ED Discharge Orders     None         Pricilla Loveless, MD 12/18/22 2304

## 2022-12-18 NOTE — Discharge Instructions (Addendum)
If you develop worsening, continued, or recurrent abdominal pain, uncontrolled vomiting, fever, chest or back pain, or any other new/concerning symptoms then return to the ER for evaluation.  

## 2022-12-26 NOTE — Discharge Summary (Signed)
Physician Discharge Summary  Patient ID: Heather Calderon MRN: [id number] DOB/AGE: 1995-11-30 27 y.o.  Admit date: 12/15/2022 Discharge date: 12/16/2022  Admission Diagnoses Liver hematoma and contusion [S36.112A]  Discharge Diagnoses Liver hematoma and contusion [S36.112A] ?ruptured hemorrhagic ovarian cyst Left likely hemorrhagic ovarian cyst   Consultants none  Procedures none  HPI: The patient is a 27 year old white female who fell twice this afternoon while rollerblading. She landed both times on the right side. She did not hit her head. She did not lose consciousness. After the fall she started complaining of pelvic and abdominal pain. She came to the ER where a CT scan showed what looks like a very small amount of blood in the abdomen which I think is likely from a small injury to the outer aspect of the liver but it is difficult to see it so small. She is otherwise in good health.   Hospital Course:   Patient was admitted to the trauma service for further evaluation and treatment. Imaging showed intraabdominal free fluid possibly related to a liver contusion but also with findings concerning for ruptured hemorrhagic ovarian cyst. Her hemoglobin was monitored and remained stable after mobilization. Diet was advanced which she tolerated. Pain was controlled. She was instructed to follow up with her established gynecologist for repeat imaging of complex free fluid/possible ruptured cyst .  On date of discharge patient had appropriately progressed and met criteria for safe discharge home with the support of family.  I discussed discharge instructions with patient as well as return precautions and all questions and concerns were addressed.   I or a member of my team have reviewed this patient in the Controlled Substance Database.  Patient agrees to follow up as below.  Allergies as of 12/16/2022       Reactions   Coconut Flavor Shortness Of Breath   Hydrocodone Shortness  Of Breath   Kiwi Extract Anaphylaxis, Itching   Banana Itching   Cucumber Extract Itching   Hydrocodone-acetaminophen    Other Itching, Swelling, Other (See Comments)   Tree nuts, melons, bananas   Oxycodone    CHEST TIGHTNESS   Sulfa Antibiotics Hives   Sumatriptan Other (See Comments)   Pt stated the medication makes her muscles seize up and she begins to feel inflamed         Medication List     TAKE these medications    hydrOXYzine 25 MG capsule Commonly known as: VISTARIL Take 25 mg by mouth daily as needed for anxiety.   lurasidone 40 MG Tabs tablet Commonly known as: LATUDA Take 40 mg by mouth at bedtime.   mirtazapine 15 MG tablet Commonly known as: REMERON Take 15 mg by mouth at bedtime.   polyethylene glycol 17 g packet Commonly known as: MIRALAX / GLYCOLAX Take 17 g by mouth daily as needed for mild constipation or moderate constipation.   propranolol 10 MG tablet Commonly known as: INDERAL Take 10 mg by mouth 2 (two) times daily.   Qelbree 200 MG 24 hr capsule Generic drug: viloxazine ER Take 400 mg by mouth at bedtime.   VITAMIN D-3 PO Take 600 Units by mouth daily.       ASK your doctor about these medications    traMADol 50 MG tablet Commonly known as: ULTRAM Take 1 tablet (50 mg total) by mouth every 6 (six) hours as needed for up to 5 days. Ask about: Should I take this medication?           Signed:  Eric Form , [facility] 12/26/2022, 2:33 PM Please see Amion for pager number during day hours 7:00am-4:30pm
